# Patient Record
Sex: Male | Born: 1975 | Race: Black or African American | Hispanic: No | Marital: Married | Smoking: Never smoker
Health system: Southern US, Community
[De-identification: ages and names within clinical notes are randomized; demographics above are authoritative.]

## PROBLEM LIST (undated history)

## (undated) DIAGNOSIS — Z789 Other specified health status: Secondary | ICD-10-CM

## (undated) HISTORY — DX: Other specified health status: Z78.9

---

## 2008-05-26 HISTORY — PX: MIDDLE EAR SURGERY: SHX713

## 2016-10-08 ENCOUNTER — Encounter (HOSPITAL_COMMUNITY): Payer: Self-pay | Admitting: Emergency Medicine

## 2016-10-08 ENCOUNTER — Emergency Department (HOSPITAL_COMMUNITY)
Admission: EM | Admit: 2016-10-08 | Discharge: 2016-10-08 | Disposition: A | Discharge status: Home / Self Care | Payer: Self-pay | Attending: Emergency Medicine | Admitting: Emergency Medicine

## 2016-10-08 ENCOUNTER — Emergency Department (HOSPITAL_COMMUNITY): Payer: Self-pay

## 2016-10-08 DIAGNOSIS — W1839XA Other fall on same level, initial encounter: Secondary | ICD-10-CM | POA: Insufficient documentation

## 2016-10-08 DIAGNOSIS — M25561 Pain in right knee: Secondary | ICD-10-CM | POA: Insufficient documentation

## 2016-10-08 DIAGNOSIS — Y999 Unspecified external cause status: Secondary | ICD-10-CM | POA: Insufficient documentation

## 2016-10-08 DIAGNOSIS — Y92322 Soccer field as the place of occurrence of the external cause: Secondary | ICD-10-CM | POA: Insufficient documentation

## 2016-10-08 DIAGNOSIS — Y9366 Activity, soccer: Secondary | ICD-10-CM | POA: Insufficient documentation

## 2016-10-08 MED ORDER — NAPROXEN 250 MG PO TABS
375.0000 mg | ORAL_TABLET | Freq: Once | ORAL | Status: AC
  Administered 2016-10-08: 375 mg via ORAL
  Filled 2016-10-08: qty 2

## 2016-10-08 NOTE — ED Notes (Signed)
Ortho tech notified.  

## 2016-10-08 NOTE — ED Triage Notes (Signed)
Pt. Stated, I was playing soccer and hurt my left knee

## 2016-10-08 NOTE — ED Notes (Signed)
Patient transported to X-ray 

## 2016-10-08 NOTE — Progress Notes (Signed)
Orthopedic Tech Progress Note Patient Details:  Dan Jimenez [birth year] [id number]  Ortho Devices Type of Ortho Device: Knee Immobilizer Ortho Device/Splint Location: rle Ortho Device/Splint Interventions: Application   Nikki Domrawford, Taj Nevins 10/08/2016, 12:40 PM

## 2016-10-08 NOTE — ED Notes (Signed)
Pt injured right knee while playing soccer last week. Pain is right lateral knee. No swelling or deformity noted.

## 2016-10-08 NOTE — Discharge Instructions (Signed)
X-ray showed no signs of fracture. Motrin and tylenol as needed for pain. Ice affected area (see instructions below).  Please call the orthopedic physician listed today or first thing in the morning to schedule a follow up appointment.   Wear the splint while mobile.  COLD THERAPY DIRECTIONS:  Ice or gel packs can be used to reduce both pain and swelling. Ice is the most helpful within the first 24 to 48 hours after an injury or flareup from overusing a muscle or joint.  Ice is effective, has very few side effects, and is safe for most people to use.   If you expose your skin to cold temperatures for too long or without the proper protection, you can damage your skin or nerves. Watch for signs of skin damage due to cold.   HOME CARE INSTRUCTIONS  Follow these tips to use ice and cold packs safely.  Place a dry or damp towel between the ice and skin. A damp towel will cool the skin more quickly, so you may need to shorten the time that the ice is used.  For a more rapid response, add gentle compression to the ice.  Ice for no more than 10 to 20 minutes at a time. The bonier the area you are icing, the less time it will take to get the benefits of ice.  Check your skin after 5 minutes to make sure there are no signs of a poor response to cold or skin damage.  Rest 20 minutes or more in between uses.  Once your skin is numb, you can end your treatment. You can test numbness by very lightly touching your skin. The touch should be so light that you do not see the skin dimple from the pressure of your fingertip. When using ice, most people will feel these normal sensations in this order: cold, burning, aching, and numbness.

## 2016-10-08 NOTE — ED Provider Notes (Signed)
[facility]-EMERGENCY DEPT Provider Note    By signing my name below, I, Earmon PhoenixJennifer Waddell, attest that this documentation has been prepared under the direction and in the presence of Demetrios LollKenneth Jorden Minchey, PA-C. Electronically Signed: Earmon PhoenixJennifer Waddell, ED Scribe. 10/08/16. 12:38 PM.    History   Chief Complaint Chief Complaint  Patient presents with  . Knee Pain   The history is provided by the patient and medical records. No language interpreter was used.    Dan Jimenez is a 41 y.o. male who presents to the Emergency Department complaining of right lateral knee pain that began last week after falling on it playing soccer. He has taken Ibuprofen for pain. Walking on it increases the pain. He denies alleviating factors. He denies numbness, tingling or weakness of the RLE, bruising, wounds.   History reviewed. No pertinent past medical history.  There are no active problems to display for this patient.   History reviewed. No pertinent surgical history.     Home Medications    Prior to Admission medications   Not on File    Family History No family history on file.  Social History Social History  Substance Use Topics  . Smoking status: Never Smoker  . Smokeless tobacco: Never Used  . Alcohol use No     Allergies   Patient has no allergy information on record.   Review of Systems Review of Systems  Musculoskeletal: Positive for arthralgias and myalgias. Negative for joint swelling.  Skin: Negative for color change and wound.  Neurological: Negative for weakness and numbness.     Physical Exam Updated Vital Signs BP (!) 132/96 (BP Location: Right Arm)   Pulse 69   Temp 98.6 F (37 C) (Oral)   Resp 16   Ht 5\' 10"  (1.778 m)   Wt 170 lb (77.1 kg)   SpO2 100%   BMI 24.39 kg/m   Physical Exam  Constitutional: He is oriented to person, place, and time. He appears well-developed and well-nourished.  HENT:  Head: Normocephalic and atraumatic.  Neck:  Normal range of motion.  Cardiovascular: Normal rate.   Dorsalis Pedis pulses 2+ bilaterally. Cap refill normal.  Pulmonary/Chest: Effort normal.  Musculoskeletal: Normal range of motion.  Full ROM of right knee. No obvious deformity, edema, ecchymosis, crepitus or erythema. Point tenderness to lateral joint line. No joint laxity.  Neurological: He is alert and oriented to person, place, and time.  Sensations intact.  Skin: Skin is warm and dry.  Psychiatric: He has a normal mood and affect. His behavior is normal.  Nursing note and vitals reviewed.    ED Treatments / Results  DIAGNOSTIC STUDIES: Oxygen Saturation is 100% on RA, normal by my interpretation.   COORDINATION OF CARE: 12:14 PM- Will order knee immobilizer. Offered crutches but pt has one at bedside that he states is enough. Will give Naproxen prior to d/c. Encouraged RICE therapy. Pt verbalizes understanding and agrees to plan.  Medications  naproxen (NAPROSYN) tablet 375 mg (375 mg Oral Given 10/08/16 1234)    Labs (all labs ordered are listed, but only abnormal results are displayed) Labs Reviewed - No data to display  EKG  EKG Interpretation None       Radiology Dg Knee Complete 4 Views Right  Result Date: 10/08/2016 CLINICAL DATA:  Soccer injury yesterday with right knee pain and swelling. EXAM: RIGHT KNEE - COMPLETE 4+ VIEW COMPARISON:  None. FINDINGS: No evidence of fracture, dislocation, or joint effusion. No evidence of arthropathy or other  focal bone abnormality. Soft tissues are unremarkable. IMPRESSION: Negative. Electronically Signed   By: Elberta Fortis M.D.   On: 10/08/2016 11:51    Procedures Procedures (including critical care time)  Medications Ordered in ED Medications  naproxen (NAPROSYN) tablet 375 mg (375 mg Oral Given 10/08/16 1234)     Initial Impression / Assessment and Plan / ED Course  I have reviewed the triage vital signs and the nursing notes.  Pertinent labs & imaging  results that were available during my care of the patient were reviewed by me and considered in my medical decision making (see chart for details).     Patient presents to the ED with right knee pain following a mechanical fall playing soccer 1 week ago. Patient X-Ray negative for obvious fracture or dislocation. Pt advised to follow up with orthopedics. Patient given knee immobilizer while in ED, conservative therapy recommended and discussed. Patient will be discharged home & is agreeable with above plan. Returns precautions discussed. Pt appears safe for discharge.   Final Clinical Impressions(s) / ED Diagnoses   Final diagnoses:  Acute pain of right knee    New Prescriptions New Prescriptions   No medications on file   I personally performed the services described in this documentation, which was scribed in my presence. The recorded information has been reviewed and is accurate.     Rise Mu, PA-C 10/08/16 1253    Lavera Guise, MD 10/08/16 [phone]

## 2016-10-17 ENCOUNTER — Ambulatory Visit (INDEPENDENT_AMBULATORY_CARE_PROVIDER_SITE_OTHER): Payer: Self-pay | Admitting: Family Medicine

## 2016-10-17 ENCOUNTER — Ambulatory Visit: Payer: Self-pay | Admitting: Family Medicine

## 2016-10-17 ENCOUNTER — Encounter: Payer: Self-pay | Admitting: Family Medicine

## 2016-10-17 VITALS — BP 130/84 | HR 66 | Temp 97.9°F | Resp 14 | Ht 69.5 in | Wt 184.0 lb

## 2016-10-17 DIAGNOSIS — S8991XD Unspecified injury of right lower leg, subsequent encounter: Secondary | ICD-10-CM

## 2016-10-17 DIAGNOSIS — Z789 Other specified health status: Secondary | ICD-10-CM | POA: Insufficient documentation

## 2016-10-17 DIAGNOSIS — M25561 Pain in right knee: Secondary | ICD-10-CM

## 2016-10-17 MED ORDER — KETOROLAC TROMETHAMINE 30 MG/ML IJ SOLN
30.0000 mg | Freq: Once | INTRAMUSCULAR | Status: AC
  Administered 2016-10-17: 30 mg via INTRAMUSCULAR

## 2016-10-17 MED ORDER — IBUPROFEN 600 MG PO TABS: 600.0000 mg | ORAL_TABLET | Freq: Three times a day (TID) | ORAL | 0 refills | Status: DC | PRN

## 2016-10-17 MED FILL — IBUPROFEN 600 MG TABLET: 600 | 10 days supply | Qty: 30 | Fill #0

## 2016-10-17 NOTE — Patient Instructions (Addendum)
20  4           interchangeabley Ibuprofen 600   8                       3-4.          .              .           .               .     :         .            (   ).          .        .  ()    .             .       :             Recommend applying ice to right knee 20 minutes 4 times per day as needed Apply warm, moist compresses interchangeabley Ibuprofen 600 mg every 8 hours as needed Elevate right knee to heart level while at rest  Refrain from weight bearing over the next 3-4 weeks.     Place knee pain patient instructions here.  Knee Pain, Adult Many things can cause knee pain. The pain often goes away on its own with time and rest. If the pain does not go away, tests may be done to find out what is causing the pain. Follow these instructions at home: Activity  Rest your knee.  Do not do things that cause pain.  Avoid activities where both feet leave the ground at the same time (high-impact activities). Examples are running, jumping rope, and doing jumping jacks. General instructions  Take medicines only as told by your doctor.  Raise (elevate) your knee when you are resting. Make sure your knee is higher than your heart.  Sleep with a pillow under your knee.  If  told, put ice on the knee: ? Put ice in a plastic bag. ? Place a towel between your skin and the bag. ? Leave the ice on for 20 minutes, 2-3 times a day.  Ask your doctor if you should wear an elastic knee support.  Lose weight if you are overweight. Being overweight can make your knee hurt more.  Do not use any tobacco products. These include cigarettes, chewing tobacco, or electronic cigarettes. If you need help quitting, ask your doctor. Smoking may slow down healing. Contact a doctor if:  The pain does not stop.  The pain changes or gets worse.  You have a fever along with knee pain.  Your knee gives out or locks up.  Your knee swells, and becomes worse. Get help right away if:  Your knee feels warm.  You cannot move your knee.  You have very bad knee pain.  You have chest pain.  You have trouble breathing. Summary  Many things can cause knee pain. The pain often goes away on its own with time and rest.  Avoid activities that put stress on your knee. These include running and jumping rope.  Get help right away if you cannot move your knee, or if your knee feels warm, or if you have trouble breathing. This information is not intended to replace advice given to you by your health care provider.  Make sure you discuss any questions you have with your health care provider. Document Released: 07/01/2008 Document Revised: 03/29/2016 Document Reviewed: 03/29/2016 Elsevier Interactive Patient Education  2017 [employer]

## 2016-10-17 NOTE — Progress Notes (Signed)
Subjective:    Patient ID: Dan Jimenez, male    DOB: 1976/03/29, 41 y.o.   MRN: [id number]  HPI Mr. Dyke BrackettMohamed Venturino, a 41 year old male that presents to establish care. He primarily speaks Arabic, utilizing video interpreter to assist with communication. He was treated and evaluated in the emergency department on 10/08/16 after sustaining a right knee injury playing soccer. Patient had a right knee xray that was negative for fractures or bony abnormalities. His pain intensity is 8/10 described as constant and throbbing. Pain is worsened with weight bearing. He last had Ibuprofen 2 days ago without sustained relief. He is wearing a right leg immobilizer and using a crutch to assist with ambulation. He denies numbness, weakness, or tingling. Patient is requesting a referral to an orthopedic specialist.   Past Medical History:  Diagnosis Date  . Language barrier to communication     Social History   Social History  . Marital status: Married    Spouse name: N/A  . Number of children: N/A  . Years of education: N/A   Occupational History  . Not on file.   Social History Main Topics  . Smoking status: Never Smoker  . Smokeless tobacco: Never Used  . Alcohol use No  . Drug use: No  . Sexual activity: Not on file   Other Topics Concern  . Not on file   Social History Narrative  . No narrative on file    Review of Systems  Constitutional: Negative.   HENT: Negative.   Respiratory: Negative.   Cardiovascular: Negative.   Gastrointestinal: Negative.   Endocrine: Negative.   Genitourinary: Negative.   Musculoskeletal: Positive for joint swelling and myalgias (Right knee pain).  Neurological: Negative.   Hematological: Negative.   Psychiatric/Behavioral: Negative.        Objective:   Physical Exam  Constitutional: He appears well-developed.  HENT:  Head: Normocephalic and atraumatic.  Right Ear: External ear normal.  Left Ear: External ear normal.  Nose:  Nose normal.  Mouth/Throat: Oropharynx is clear and moist.  Eyes: Conjunctivae are normal. Pupils are equal, round, and reactive to light.  Neck: Normal range of motion. Neck supple.  Abdominal: Soft. Bowel sounds are normal.  Musculoskeletal:       Right shoulder: He exhibits decreased range of motion, tenderness, swelling, pain and decreased strength. He exhibits no bony tenderness and no spasm.       Right knee: He exhibits decreased range of motion and swelling. Tenderness found.  Skin: Skin is warm and dry.  Psychiatric: He has a normal mood and affect. His behavior is normal. Judgment and thought content normal.      BP 130/84 (BP Location: Right Arm, Patient Position: Sitting, Cuff Size: Normal)   Pulse 66   Temp 97.9 F (36.6 C) (Oral)   Resp 14   Ht 5' 9.5" (1.765 m)   Wt 184 lb (83.5 kg)   SpO2 99%   BMI 26.78 kg/m  Assessment & Plan:  1. Acute pain of right knee Recommend applying ice pack to right 20 minutes 4 times per day as needed. Apply warm compresses interchangeably.  Elevate knee to heart level, while at rest.  Continue to wear immobilizer. Patient is requesting a referral to orthopedic specialist for further evaluation Reviewed right knee xray, unremarkable.  - Sedimentation Rate - C-reactive protein - ketorolac (TORADOL) 30 MG/ML injection 30 mg; Inject 1 mL (30 mg total) into the muscle once. - ibuprofen (ADVIL,MOTRIN) 600 MG  tablet; Take 1 tablet (600 mg total) by mouth every 8 (eight) hours as needed.  Dispense: 30 tablet; Refill: 0 - Ambulatory referral to Sports Medicine  2. Injury of right knee, subsequent encounter - ketorolac (TORADOL) 30 MG/ML injection 30 mg; Inject 1 mL (30 mg total) into the muscle once. - ibuprofen (ADVIL,MOTRIN) 600 MG tablet; Take 1 tablet (600 mg total) by mouth every 8 (eight) hours as needed.  Dispense: 30 tablet; Refill: 0 - Ambulatory referral to Sports Medicine  3. Language barrier to communication Utilized video  interpreter to assist with communication    RTC: Follow up in office for CPE    Nolon Nations  MSN, FNP-C [facility] Patient [facility] [address] [place], [place] [address] [phone]

## 2016-10-18 LAB — SEDIMENTATION RATE: Sed Rate: 1 mm/hr (ref 0–15)

## 2016-10-18 LAB — C-REACTIVE PROTEIN: CRP: 1.8 mg/L (ref <8.0)

## 2016-10-20 ENCOUNTER — Ambulatory Visit: Payer: Self-pay | Attending: Internal Medicine

## 2016-10-26 ENCOUNTER — Ambulatory Visit: Payer: Self-pay | Admitting: Family Medicine

## 2016-10-27 ENCOUNTER — Encounter: Payer: Self-pay | Admitting: Sports Medicine

## 2016-10-27 ENCOUNTER — Ambulatory Visit (INDEPENDENT_AMBULATORY_CARE_PROVIDER_SITE_OTHER): Payer: Self-pay | Admitting: Sports Medicine

## 2016-10-27 VITALS — BP 127/70 | Ht 72.84 in | Wt 183.0 lb

## 2016-10-27 DIAGNOSIS — M25561 Pain in right knee: Secondary | ICD-10-CM

## 2016-10-28 NOTE — Progress Notes (Signed)
   Subjective:    Patient ID: Dan Jimenez, male    DOB: May 11, 1975, 41 y.o.   MRN: [id number]  HPI chief complaint: Right knee pain  Very pleasant 41 year old male comes in today after having injured his right knee playing soccer on June 23. He fell onto the right knee and heard a pop. He had immediate pain and was unable to continue playing. Pain was severe enough that he sought treatment in the emergency room. X-rays were obtained and are available for review. He was given a set of crutches and prescribed ibuprofen. Majority of his pain is along the lateral knee. He is still having difficulty bearing weight. He noticed some swelling shortly after the injury but that has since subsided. He denies any problems with this knee in the past. No prior knee surgeries. He denies pain in his hip. Denies numbness or tingling into his foot.  Past medical history reviewed Medications reviewed Allergies reviewed    Review of Systems    as above Objective:   Physical Exam  Well-developed, well-nourished. No acute distress. Awake alert and oriented 3. Vital signs reviewed  Right knee: Range of motion 0-120. No obvious effusion. Patient is tender to palpation along the lateral joint line with a positive Thessaly's. Equivocal McMurray's. Slight tenderness over the lateral aspect of the proximal tibia as well. Knee is stable to valgus and varus stressing. Negative Lachman's, negative anterior drawer. Negative posterior drawer. Negative patellar apprehension. Negative dial test. Neurovascularly intact distally. Walking with a limp and the assistance of crutches.  X-rays from 10/08/2016 are reviewed. The included AP, lateral, and oblique views from the emergency room. These films show no obvious fracture.      Assessment & Plan:   Right knee pain-rule out lateral meniscal tear  MRI specifically to rule out a lateral meniscal tear. Patient will follow-up with me in the office one day after  that study to discuss the results and delineate a more definitive treatment plan. In the meantime, he will continue with his compression sleeve and will continue using his crutches to assist with ambulation. He may also continue with ibuprofen as needed for pain. He is encouraged to call with any questions or concerns he may have prior to his follow-up visit.

## 2016-10-31 ENCOUNTER — Other Ambulatory Visit: Payer: Self-pay

## 2016-10-31 ENCOUNTER — Ambulatory Visit: Payer: Self-pay | Admitting: Family Medicine

## 2016-10-31 ENCOUNTER — Ambulatory Visit
Admission: RE | Admit: 2016-10-31 | Discharge: 2016-10-31 | Disposition: A | Discharge status: Home / Self Care | Payer: No Typology Code available for payment source | Source: Ambulatory Visit | Attending: Sports Medicine | Admitting: Sports Medicine

## 2016-10-31 DIAGNOSIS — M25561 Pain in right knee: Secondary | ICD-10-CM

## 2016-11-03 ENCOUNTER — Ambulatory Visit (INDEPENDENT_AMBULATORY_CARE_PROVIDER_SITE_OTHER): Payer: Self-pay | Admitting: Sports Medicine

## 2016-11-03 ENCOUNTER — Encounter: Payer: Self-pay | Admitting: Sports Medicine

## 2016-11-03 VITALS — BP 126/80 | Ht 72.0 in | Wt 182.0 lb

## 2016-11-03 DIAGNOSIS — S83511D Sprain of anterior cruciate ligament of right knee, subsequent encounter: Secondary | ICD-10-CM

## 2016-11-03 DIAGNOSIS — M25561 Pain in right knee: Secondary | ICD-10-CM

## 2016-11-03 MED ORDER — MELOXICAM 15 MG PO TABS: 15.0000 mg | ORAL_TABLET | Freq: Every day | ORAL | 1 refills | Status: DC

## 2016-11-03 MED FILL — ?MELOXICAM 15MG TABLET: 15 | 40 days supply | Qty: 40 | Fill #0

## 2016-11-03 NOTE — Progress Notes (Signed)
  Patient comes in today to discuss MRI findings of his right knee. MRI shows findings consistent with a partial anterior cruciate ligament tear. This may be chronic. There is no bone bruising. No meniscal tears. No significant degenerative changes. I recommended conservative treatment with a double upright brace and physical therapy. Patient's given a prescription for meloxicam to take as needed for pain. Follow-up with me in 4 weeks for reevaluation. Call with questions or concerns in the interim.  Total time spent with the patient was 10 minutes with greater than 50% of the time spent in face-to-face consultation discussing MRI findings and treatment plan.

## 2016-11-05 ENCOUNTER — Other Ambulatory Visit: Payer: Self-pay

## 2016-11-06 ENCOUNTER — Encounter: Payer: Self-pay | Admitting: Family Medicine

## 2016-11-07 ENCOUNTER — Encounter: Payer: Self-pay | Admitting: Family Medicine

## 2016-11-07 ENCOUNTER — Ambulatory Visit: Payer: Self-pay | Admitting: Sports Medicine

## 2016-11-07 ENCOUNTER — Ambulatory Visit (INDEPENDENT_AMBULATORY_CARE_PROVIDER_SITE_OTHER): Payer: No Typology Code available for payment source | Admitting: Family Medicine

## 2016-11-07 VITALS — BP 130/91 | HR 62 | Temp 97.9°F | Resp 16 | Ht 72.0 in | Wt 187.0 lb

## 2016-11-07 DIAGNOSIS — Z114 Encounter for screening for human immunodeficiency virus [HIV]: Secondary | ICD-10-CM

## 2016-11-07 DIAGNOSIS — Z23 Encounter for immunization: Secondary | ICD-10-CM

## 2016-11-07 DIAGNOSIS — Z0000 Encounter for general adult medical examination without abnormal findings: Secondary | ICD-10-CM

## 2016-11-07 DIAGNOSIS — Z789 Other specified health status: Secondary | ICD-10-CM

## 2016-11-07 DIAGNOSIS — R030 Elevated blood-pressure reading, without diagnosis of hypertension: Secondary | ICD-10-CM

## 2016-11-07 NOTE — Patient Instructions (Signed)

## 2016-11-07 NOTE — Progress Notes (Signed)
Subjective:    Patient ID: Dan Jimenez, male    DOB: [birth year], 41 y.o.   MRN: [id number]  HPI Dan Jimenez, a 41 year old male presents for a routine physical examination. He primarily speaks Arabic, using video interpreter to assist with communication.  Patient generally feels well and is without complaint. He eats a balanced diet and typically gets 5-6 hours of sleep. He does not exercise routinely, but plays soccer with friends on the weekend. He is not sexually active. He does not wear sunscreen. He has not had a routine dental or eye exam. He denies headache, chest pain, palpitation, fatigue, dysuria, nausea, vomiting, or diarrhea.   Past Medical History:  Diagnosis Date  . Language barrier to communication    Immunization History  Administered Date(s) Administered  . Tdap 11/07/2016    No Known Allergies Review of Systems  Constitutional: Negative.  Negative for fatigue and fever.  HENT: Negative.   Eyes: Negative.   Respiratory: Negative.   Cardiovascular: Negative.  Negative for chest pain, palpitations and leg swelling.  Gastrointestinal: Negative.   Endocrine: Negative.   Genitourinary: Negative.   Musculoskeletal: Negative.   Skin: Negative.  Negative for color change, pallor and rash.  Allergic/Immunologic: Negative.  Negative for immunocompromised state.  Neurological: Negative.  Negative for dizziness, facial asymmetry and headaches.  Hematological: Negative.   Psychiatric/Behavioral: Negative.        Objective:   Physical Exam  Constitutional: He is oriented to person, place, and time. He appears well-developed and well-nourished.  HENT:  Head: Normocephalic and atraumatic.  Right Ear: External ear normal.  Left Ear: External ear normal.  Nose: Nose normal.  Mouth/Throat: Oropharynx is clear and moist.  Eyes: Pupils are equal, round, and reactive to light. Conjunctivae and EOM are normal.  Neck: Normal range of motion. Neck supple.   Pulmonary/Chest: Effort normal and breath sounds normal.  Abdominal: Soft.  Musculoskeletal: Normal range of motion.  Neurological: He is alert and oriented to person, place, and time. He has normal reflexes.  Skin: Skin is warm and dry.  Psychiatric: He has a normal mood and affect. His behavior is normal. Judgment and thought content normal.     BP (!) 130/91 (BP Location: Right Arm, Patient Position: Sitting, Cuff Size: Normal)   Pulse 62   Temp 97.9 F (36.6 C) (Oral)   Resp 16   Ht 6' (1.829 m)   Wt 187 lb (84.8 kg)   SpO2 100%   BMI 25.36 kg/m  Assessment & Plan:   1. Physical exam, routine Physical exam unremarkable. He refuses prostate exam. Will return for screening laboratory values.  - COMPLETE METABOLIC PANEL WITH GFR; Future - CBC with Differential/Platelet; Future - HIV antibody (with reflex); Future  2. Blood pressure elevated without history of HTN Blood pressure is noted to be borderline elevated today, but normal in the past. No treatment warranted at this time.   3. Language barrier to communication Patient primarily speaks Arabic, utilized video interpreter to communicate  4. Screening for HIV (human immunodeficiency virus) - HIV antibody (with reflex); Future  5. Need for Tdap vaccination Received Tdap vaccination without complication   Preventative health maintenance:  Recommend cholesterol screening Recommend prostate exam Recommend a lowfat, low carbohydrate diet divided over 5-6 small meals, increase water intake to 6-8 glasses, and 150 minutes per week of cardiovascular exercise.    RTC: 1 year for annual physical or as needed   Nolon Nations  MSN, FNP-C  [facility] Patient [facility] [address]  [place], [place] [id number] [phone]

## 2016-11-08 ENCOUNTER — Encounter: Payer: Self-pay | Admitting: Physical Therapy

## 2016-11-08 ENCOUNTER — Ambulatory Visit: Payer: Self-pay | Admitting: Sports Medicine

## 2016-11-08 ENCOUNTER — Ambulatory Visit: Payer: Self-pay | Attending: Family Medicine | Admitting: Physical Therapy

## 2016-11-08 DIAGNOSIS — M25661 Stiffness of right knee, not elsewhere classified: Secondary | ICD-10-CM | POA: Insufficient documentation

## 2016-11-08 DIAGNOSIS — R262 Difficulty in walking, not elsewhere classified: Secondary | ICD-10-CM | POA: Insufficient documentation

## 2016-11-08 DIAGNOSIS — M25561 Pain in right knee: Secondary | ICD-10-CM | POA: Insufficient documentation

## 2016-11-08 NOTE — Therapy (Signed)
[facility] [address] [place], [place], [address] Phone: [phone]   Fax:  [phone]  Physical Therapy Evaluation  Patient Details  Name: Era BumpersMohamed M D Fessenden MRN: [id number] Date of Birth: [birth year] Referring Provider: Dr Reino Bellisimothy Draper   Encounter Date: 11/08/2016      PT End of Session - 11/08/16 1155    Visit Number 1   Number of Visits 16   Date for PT Re-Evaluation 01/03/17   Authorization Type CAFA   PT Start Time 1145   PT Stop Time 1232   PT Time Calculation (min) 47 min   Activity Tolerance Patient tolerated treatment well   Behavior [facility] assessed/performed      Past Medical History:  Diagnosis Date  . Language barrier to communication     Past Surgical History:  Procedure Laterality Date  . MIDDLE EAR SURGERY Left 05/26/2008    There were no vitals filed for this visit.       Subjective Assessment - 11/08/16 1151    Subjective Patient was playing soccer 4 weeks ago when he hurt his knee and partially tore his right ACL 50%. He has had some pain but it has improved. He mostly have pain when he is walking and carrying heavey objects.    Limitations Standing;Walking;House hold activities   How long can you sit comfortably? No limit    How long can you stand comfortably? > 45 minutes    How long can you walk comfortably? pain after about a mile    Diagnostic tests right knee MRI 50 % tear of the ACL    Currently in Pain? Yes   Pain Score 3    Pain Location Knee   Pain Orientation Right   Pain Descriptors / Indicators Aching   Pain Type Acute pain   Pain Onset 1 to 4 weeks ago   Pain Frequency Intermittent   Aggravating Factors  walking distances, carrying heaey objects   Pain Relieving Factors ice    Effect of Pain on Daily Activities difficulty walking distances and carry objects             [facility] - 11/08/16 0001      Assessment   Medical  Diagnosis Right partial ACL tear    Referring Provider Dr Reino Bellisimothy Draper    Onset Date/Surgical Date 10/08/16   Hand Dominance Right   Next MD Visit 11/10/2016   Prior Therapy None      Precautions   Precautions None     Restrictions   Weight Bearing Restrictions No     Balance Screen   Has the patient fallen in the past 6 months No   Has the patient had a decrease in activity level because of a fear of falling?  No   Is the patient reluctant to leave their home because of a fear of falling?  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      Cognition   Overall Cognitive Status Within Functional Limits for tasks assessed   Attention Focused   Focused Attention Appears intact   Memory Appears intact   Awareness Appears intact   Problem Solving Appears intact  Observation/Other Assessments   Focus on Therapeutic Outcomes (FOTO)  speaks arabic      Sensation   Light Touch Appears Intact   Additional Comments denies parathesias      Coordination   Gross Motor Movements are Fluid and Coordinated Yes   Fine Motor Movements are Fluid and Coordinated Yes     Functional Tests   Functional tests Single leg stance;Step up     Step Up   Comments patient can step up but right knee kicks into extension instead of having a smooth step up      Single Leg Stance   Comments 10 seconds with minor instability; patient reports improvement over the last few weeks      ROM / Strength   AROM / PROM / Strength AROM;Strength;PROM     AROM   AROM Assessment Site Knee   Right/Left Knee Right;Left   Right Knee Extension 0   Right Knee Flexion 110   Left Knee Extension 0   Left Knee Flexion 140     PROM   PROM Assessment Site Knee   Right/Left Knee Right;Left   Right  Knee Extension 0   Right Knee Flexion 115   Left Knee Extension 0   Left Knee Flexion 140     Strength   Overall Strength Comments left LE 5/5    Strength Assessment Site Hip;Knee   Right/Left Hip Right   Right Hip Flexion 4/5   Right Hip Extension 4/5   Right Hip ABduction 4/5   Right/Left Knee Right   Right Knee Flexion 4+/5   Right Knee Extension 4+/5     Palpation   Patella mobility good patellar mobility    Palpation comment No tenderness to palaption      Ambulation/Gait   Gait Comments slight decrease in right single leg stance time             Objective measurements completed on examination: See above findings.          [facility] Adult PT Treatment/Exercise - 11/08/16 0001      Knee/Hip Exercises: Standing   Heel Raises Limitations 2x10     Knee/Hip Exercises: Supine   Bridges Limitations 2x10    Other Supine Knee/Hip Exercises knee flexion with strap in pain free range   Other Supine Knee/Hip Exercises 3 way hip SLR x10 but advised to progress to 3x10 as tolerated.                 PT Education - 11/08/16 1323    Education provided Yes   Education Details HEP; symptom mangement,    Person(s) Educated Patient   Methods Explanation;Demonstration;Tactile cues;Verbal cues   Comprehension Verbalized understanding;Returned demonstration;Verbal cues required;Tactile cues required          PT Short Term Goals - 11/08/16 1336      PT SHORT TERM GOAL #1   Title Patient will demsotrate 130 degrees of passive knee flexion    Time 4   Period Weeks   Status New     PT SHORT TERM GOAL #2   Title Patient will increase gross right LE strength to 4+/5    Time 4   Period Weeks   Status New     PT SHORT TERM GOAL #3   Title Patient will be independent with inital HEP dfor quad strength and stability    Time 4   Period Weeks   Status New     PT SHORT TERM GOAL #4  Title Patient will increase right single leg stance time to 30 seconds    Time 4    Period Weeks   Status New           PT Long Term Goals - 11/08/16 1404      PT LONG TERM GOAL #1   Title Patient will ambualte 2 miles without selfreport of pain in order to perfrom daily tasks    Time 8   Period Weeks   Status New   Target Date 01/03/17     PT LONG TERM GOAL #2   Title Patient will go up/ down 8 steps with reciprocol gait pattern without increased pain    Time 8   Period Weeks   Status New   Target Date 01/03/17     PT LONG TERM GOAL #3   Title Patient will demonstrate 5/5 gross right lower extremity strength in order to perfrom work tasks without pain    Time 8   Period Weeks   Status New   Target Date 01/03/17                Plan - 11/08/16 1325    Clinical Impression Statement Patient is a 41 year old male who presents with a partial ACL tear on the right. He has decreased strength and single leg stability but it has improved. He has decreased passive range of motion on the right compared to the left. He has increased pain with ambualtion and with carrying objects. He would benefit from skilled therapy to improve right LE stability and to improve his ability to stand at work.    Clinical Presentation Stable   Clinical Decision Making Low   Rehab Potential Good   PT Frequency 2x / week   PT Duration 8 weeks   PT Treatment/Interventions ADLs/Self Care Home Management;Cryotherapy;Iontophoresis 4mg /ml Dexamethasone;Gait training;Traction;Moist Heat;Therapeutic activities;Therapeutic exercise;Neuromuscular re-education;Patient/family education;Passive range of motion;Manual techniques;Splinting;Taping   PT Next Visit Plan add mini squats, single leg stance, lateral band walk, cone drill, review step ups if able; assess range.    PT Home Exercise Plan 3 way SLR, bridging, clam, heel raise, passive knee flexion   Consulted and Agree with Plan of Care Patient      Patient will benefit from skilled therapeutic intervention in order to improve the  following deficits and impairments:  Abnormal gait, Pain, Decreased strength, Decreased endurance, Decreased activity tolerance, Difficulty walking, Decreased range of motion  Visit Diagnosis: Acute pain of right knee  Stiffness of right knee, not elsewhere classified  Difficulty in walking, not elsewhere classified     Problem List Patient Active Problem List   Diagnosis Date Noted  . Acute pain of right knee 10/17/2016  . Language barrier to communication 10/17/2016    Dessie Coma PT DPT  11/08/2016, 5:02 PM  [facility] [address] [place], [place], [address] Phone: [phone]   Fax:  [phone]  Name: DEWAUN KINZLER MRN: [id number] Date of Birth: [birth year]

## 2016-11-08 NOTE — Therapy (Deleted)
[facility] [address] [place], [place], [address] Phone: [phone]   Fax:  [phone]  Physical Therapy Treatment  Patient Details  Name: Dan BumpersMohamed M D Jimenez MRN: [id number] Date of Birth: [birth year] Referring Provider: Dr Reino Bellisimothy Draper   Encounter Date: 11/08/2016      PT End of Session - 11/08/16 1155    Visit Number 1   Number of Visits 16   Date for PT Re-Evaluation 01/03/17   Authorization Type CAFA   PT Start Time 1145   PT Stop Time 1232   PT Time Calculation (min) 47 min   Activity Tolerance Patient tolerated treatment well   Behavior During Therapy [facility] for tasks assessed/performed      Past Medical History:  Diagnosis Date  . Language barrier to communication     Past Surgical History:  Procedure Laterality Date  . MIDDLE EAR SURGERY Left 05/26/2008    There were no vitals filed for this visit.      Subjective Assessment - 11/08/16 1151    Subjective Patient was playing soccer 4 weeks ago when he hurt his knee and partially tore his right ACL 50%. He has had some pain but it has improved. He mostly have pain when he is walking and carrying heavey objects.    Limitations Standing;Walking;House hold activities   How long can you sit comfortably? No limit    How long can you stand comfortably? > 45 minutes    How long can you walk comfortably? pain after about a mile    Diagnostic tests right knee MRI 50 % tear of the ACL    Currently in Pain? Yes   Pain Score 3    Pain Location Knee   Pain Orientation Right   Pain Descriptors / Indicators Aching   Pain Type Acute pain   Pain Onset 1 to 4 weeks ago   Pain Frequency Intermittent   Aggravating Factors  walking distances, carrying heaey objects   Pain Relieving Factors ice    Effect of Pain on Daily Activities difficulty walking distances and carry objects             [facility] - 11/08/16 0001      Assessment   Medical  Diagnosis Right partial ACL tear    Referring Provider Dr Reino Bellisimothy Draper    Onset Date/Surgical Date 10/08/16   Hand Dominance Right   Next MD Visit 11/10/2016   Prior Therapy None      Precautions   Precautions None     Restrictions   Weight Bearing Restrictions No     Balance Screen   Has the patient fallen in the past 6 months No   Has the patient had a decrease in activity level because of a fear of falling?  No   Is the patient reluctant to leave their home because of a fear of falling?  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      Cognition   Overall Cognitive Status Within Functional Limits for tasks assessed   Attention Focused   Focused Attention Appears intact   Memory Appears intact   Awareness Appears intact   Problem Solving Appears intact     Observation/Other  Assessments   Focus on Therapeutic Outcomes (FOTO)  speaks arabic      Sensation   Light Touch Appears Intact   Additional Comments denies parathesias      Coordination   Gross Motor Movements are Fluid and Coordinated Yes   Fine Motor Movements are Fluid and Coordinated Yes     Functional Tests   Functional tests Single leg stance;Step up     Step Up   Comments patient can step up but right knee kicks into extension instead of having a smooth step up      Single Leg Stance   Comments 10 seconds with minor instability; patient reports improvement over the last few weeks      ROM / Strength   AROM / PROM / Strength AROM;Strength;PROM     AROM   AROM Assessment Site Knee   Right/Left Knee Right;Left   Right Knee Extension 0   Right Knee Flexion 110   Left Knee Extension 0   Left Knee Flexion 140     PROM   PROM Assessment Site Knee   Right/Left Knee Right;Left   Right  Knee Extension 0   Right Knee Flexion 115   Left Knee Extension 0   Left Knee Flexion 140     Strength   Overall Strength Comments left LE 5/5    Strength Assessment Site Hip;Knee   Right/Left Hip Right   Right Hip Flexion 4/5   Right Hip Extension 4/5   Right Hip ABduction 4/5   Right/Left Knee Right   Right Knee Flexion 4+/5   Right Knee Extension 4+/5     Palpation   Patella mobility good patellar mobility    Palpation comment No tenderness to palaption      Ambulation/Gait   Gait Comments slight decrease in right single leg stance time                      [facility] Adult PT Treatment/Exercise - 11/08/16 0001      Knee/Hip Exercises: Standing   Heel Raises Limitations 2x10     Knee/Hip Exercises: Supine   Bridges Limitations 2x10    Other Supine Knee/Hip Exercises knee flexion with strap in pain free range   Other Supine Knee/Hip Exercises 3 way hip SLR x10 but advised to progress to 3x10 as tolerated.                 PT Education - 11/08/16 1323    Education provided Yes   Education Details HEP; symptom mangement,    Person(s) Educated Patient   Methods Explanation;Demonstration;Tactile cues;Verbal cues   Comprehension Verbalized understanding;Returned demonstration;Verbal cues required;Tactile cues required          PT Short Term Goals - 11/08/16 1336      PT SHORT TERM GOAL #1   Title Patient will demsotrate 130 degrees of passive knee flexion    Time 4   Period Weeks   Status New     PT SHORT TERM GOAL #2   Title Patient will increase gross right LE strength to 4+/5    Time 4   Period Weeks   Status New     PT SHORT TERM GOAL #3   Title Patient will be independent with inital HEP dfor quad strength and stability    Time 4   Period Weeks   Status New     PT SHORT TERM GOAL #4   Title Patient will increase right single leg stance  time to 30 seconds    Time 4   Period Weeks   Status New           PT Long Term Goals -  11/08/16 1404      PT LONG TERM GOAL #1   Title Patient will ambualte 2 miles without selfreport of pain in order to perfrom daily tasks    Time 8   Period Weeks   Status New   Target Date 01/03/17     PT LONG TERM GOAL #2   Title Patient will go up/ down 8 steps with reciprocol gait pattern without increased pain    Time 8   Period Weeks   Status New   Target Date 01/03/17     PT LONG TERM GOAL #3   Title Patient will demonstrate 5/5 gross right lower extremity strength in order to perfrom work tasks without pain    Time 8   Period Weeks   Status New   Target Date 01/03/17               Plan - 11/08/16 1325    Clinical Impression Statement Patient is a 41 year old male who presents with a partial ACL tear on the right. He has decreased strength and single leg stability but it has improved. He has decreased passive range of motion on the right compared to the left. He has increased pain with ambualtion and with carrying objects. He would benefit from skilled therapy to improve right LE stability and to improve his ability to stand at work.    Clinical Presentation Stable   Clinical Decision Making Low   Rehab Potential Good   PT Frequency 2x / week   PT Duration 8 weeks   PT Treatment/Interventions ADLs/Self Care Home Management;Cryotherapy;Iontophoresis 4mg /ml Dexamethasone;Gait training;Traction;Moist Heat;Therapeutic activities;Therapeutic exercise;Neuromuscular re-education;Patient/family education;Passive range of motion;Manual techniques;Splinting;Taping   PT Next Visit Plan add mini squats, single leg stance, lateral band walk, cone drill, review step ups if able; assess range.    PT Home Exercise Plan 3 way SLR, bridging, clam, heel raise, passive knee flexion   Consulted and Agree with Plan of Care Patient      Patient will benefit from skilled therapeutic intervention in order to improve the following deficits and impairments:  Abnormal gait, Pain, Decreased  strength, Decreased endurance, Decreased activity tolerance, Difficulty walking, Decreased range of motion  Visit Diagnosis: Acute pain of right knee  Stiffness of right knee, not elsewhere classified  Difficulty in walking, not elsewhere classified     Problem List Patient Active Problem List   Diagnosis Date Noted  . Acute pain of right knee 10/17/2016  . Language barrier to communication 10/17/2016    Dessie Coma 11/08/2016, 5:01 PM  [facility] [address] [place], [place], [address] Phone: [phone]   Fax:  [phone]  Name: GERVASE COLBERG MRN: [id number] Date of Birth: [birth year]

## 2016-11-09 ENCOUNTER — Ambulatory Visit: Payer: Self-pay | Admitting: Physical Therapy

## 2016-11-09 DIAGNOSIS — R262 Difficulty in walking, not elsewhere classified: Secondary | ICD-10-CM

## 2016-11-09 DIAGNOSIS — M25661 Stiffness of right knee, not elsewhere classified: Secondary | ICD-10-CM

## 2016-11-09 DIAGNOSIS — M25561 Pain in right knee: Secondary | ICD-10-CM

## 2016-11-09 NOTE — Therapy (Signed)
[place], [place], [address] Phone: [phone]   Fax:  [phone]  Physical Therapy Treatment  Patient Details  Name: Dan Jimenez MRN: [id number] Date of Birth: [birth year] Referring Provider: Dr Lilia Argue   Encounter Date: 11/09/2016      PT End of Session - 11/09/16 1027    Visit Number 2   Number of Visits 16   Date for PT Re-Evaluation 01/03/17   Authorization Type CAFA   PT Start Time 1018   PT Stop Time 1106   PT Time Calculation (min) 48 min   Activity Tolerance Patient tolerated treatment well   Behavior During Therapy [facility] for tasks assessed/performed      Past Medical History:  Diagnosis Date  . Language barrier to communication     Past Surgical History:  Procedure Laterality Date  . MIDDLE EAR SURGERY Left 05/26/2008    There were no vitals filed for this visit.      Subjective Assessment - 11/09/16 1022    Subjective Only min pain today.  Did his exercises last night.    Currently in Pain? Yes   Pain Score 1             [facility] PT Assessment - 11/09/16 0001      AROM   Right Knee Extension 0   Right Knee Flexion 130                     [facility] Adult PT Treatment/Exercise - 11/09/16 0001      Knee/Hip Exercises: Stretches   Active Hamstring Stretch Right;3 reps;30 seconds   Knee: Self-Stretch to increase Flexion Right;5 reps;10 seconds   Knee: Self-Stretch Limitations strap      Knee/Hip Exercises: Standing   Heel Raises Both;2 sets;10 reps   Hip Flexion AAROM;Stengthening;Both;1 set;10 reps   Hip Flexion Limitations high knees , slow pace for SLS    Wall Squat 1 set;10 reps     Knee/Hip Exercises: Seated   Long Arc Quad Strengthening;Right;1 set;10 reps   Long Arc Quad Limitations red   Hamstring Curl Strengthening;Right;1 set;10 reps   Sit to [employer] 1 set;without UE support     Knee/Hip Exercises: Supine   Bridges Limitations 2  x 10    Single Leg Bridge Strengthening;Right;1 set;10 reps   Other Supine Knee/Hip Exercises 3 way hip SLR x10 but advised to progress to 3x10 as tolerated.   abd, ext, flex     Modalities   Modalities Cryotherapy     Cryotherapy   Number Minutes Cryotherapy 10 Minutes   Cryotherapy Location Knee   Type of Cryotherapy Ice pack                PT Education - 11/09/16 1449    Education provided Yes   Education Details how and when to progress exercises, proceed with consistency, ice post , HEP    Person(s) Educated Patient   Methods Explanation;Demonstration;Verbal cues;Handout   Comprehension Verbalized understanding;Returned demonstration;Need further instruction          PT Short Term Goals - 11/09/16 1454      PT SHORT TERM GOAL #1   Title Patient will demsotrate 130 degrees of passive knee flexion    Baseline 130 deg AROM    Status Achieved     PT SHORT TERM GOAL #2   Title Patient will increase gross right LE strength to 4+/5    Status Unable to assess  PT SHORT TERM GOAL #3   Title Patient will be independent with inital HEP dfor quad strength and stability    Baseline min cues    Status Partially Met     PT SHORT TERM GOAL #4   Title Patient will increase right single leg stance time to 30 seconds    Status On-going           PT Long Term Goals - 11/08/16 1404      PT LONG TERM GOAL #1   Title Patient will ambualte 2 miles without selfreport of pain in order to perfrom daily tasks    Time 8   Period Weeks   Status New   Target Date 01/03/17     PT LONG TERM GOAL #2   Title Patient will go up/ down 8 steps with reciprocol gait pattern without increased pain    Time 8   Period Weeks   Status New   Target Date 01/03/17     PT LONG TERM GOAL #3   Title Patient will demonstrate 5/5 gross right lower extremity strength in order to perfrom work tasks without pain    Time 8   Period Weeks   Status New   Target Date 01/03/17                Plan - 11/09/16 1053    Clinical Impression Statement After eval he had less pain and improved AROM in Rt. LE.  Asked him to proceed with caution as he moves to independence in the coming weeks.  He has 1 more visit before leaves for 6 weeks.  Needed min cues today for HEP, technique.     PT Next Visit Plan review HEP and consider adding lateral band walk, cone drill, review step ups if able; ?last visit    PT Home Exercise Plan 3 way SLR, bridging, clam, heel raise, passive knee flexion, level 2 knee: SL bridge, mini squat, hamstring stretch, high march for SLS    Consulted and Agree with Plan of Care Patient      Patient will benefit from skilled therapeutic intervention in order to improve the following deficits and impairments:  Abnormal gait, Pain, Decreased strength, Decreased endurance, Decreased activity tolerance, Difficulty walking, Decreased range of motion  Visit Diagnosis: Acute pain of right knee  Stiffness of right knee, not elsewhere classified  Difficulty in walking, not elsewhere classified     Problem List Patient Active Problem List   Diagnosis Date Noted  . Acute pain of right knee 10/17/2016  . Language barrier to communication 10/17/2016    PAA,JENNIFER 11/09/2016, 2:56 PM  [place], [place], [address] Phone: [phone]   Fax:  [phone]  Name: Dan Jimenez MRN: [id number] Date of Birth: [birth year]  Raeford Razor, PT 11/09/16 2:56 PM Phone: [phone] Fax: [phone]

## 2016-11-10 ENCOUNTER — Encounter: Payer: Self-pay | Admitting: Sports Medicine

## 2016-11-10 ENCOUNTER — Ambulatory Visit (INDEPENDENT_AMBULATORY_CARE_PROVIDER_SITE_OTHER): Payer: Self-pay | Admitting: Sports Medicine

## 2016-11-10 VITALS — BP 120/90

## 2016-11-10 DIAGNOSIS — M25561 Pain in right knee: Secondary | ICD-10-CM

## 2016-11-11 NOTE — Progress Notes (Signed)
   Subjective:    Patient ID: Dan Jimenez, male    DOB: March 13, 1976, 41 y.o.   MRN: [id number]  HPI   Patient comes in today earlier than expected. He has been diagnosed with a partial anterior cruciate ligament tear of the right knee. He has started physical therapy and is feeling much better. He has no pain. He is getting ready to leave town for a couple of months. He will be traveling out of the country. He would like a note to take with him to give to his physicians [place] so that he can continue his physical therapy.    Review of Systems As above    Objective:   Physical Exam  Well-developed, well-nourished. No acute distress.  Right knee: Full range of motion. There is no effusion. No soft tissue swelling. There is a mild amount of laxity(1+) with anterior drawer and Lachman testing but a good solid endpoint is felt. PCL is intact. Knee is stable to valgus and varus stressing. No joint line tenderness to palpation. Negative McMurray's. Negative Thessaly's. Neurovascularly intact distally. Walking without a limp.      Assessment & Plan:   Partial anterior cruciate ligament tear, right knee  I'll provide the patient with a note with his diagnosis and my recommendation to continue with physical therapy for a total of 8-10 weeks. He will take this to his physicians [place]. It is important for him to develop and maintain strength in his right knee to help provide stability for his partial anterior cruciate ligament tear. He understands. I also discussed bracing when he decides to return to playing soccer. He will [place]

## 2016-11-14 ENCOUNTER — Ambulatory Visit: Payer: Self-pay | Admitting: Sports Medicine

## 2016-11-14 ENCOUNTER — Ambulatory Visit: Payer: Self-pay | Admitting: Physical Therapy

## 2016-11-14 ENCOUNTER — Encounter: Payer: Self-pay | Admitting: Physical Therapy

## 2016-11-14 DIAGNOSIS — M25661 Stiffness of right knee, not elsewhere classified: Secondary | ICD-10-CM

## 2016-11-14 DIAGNOSIS — R262 Difficulty in walking, not elsewhere classified: Secondary | ICD-10-CM

## 2016-11-14 DIAGNOSIS — M25561 Pain in right knee: Secondary | ICD-10-CM

## 2016-11-14 NOTE — Therapy (Signed)
[place], [place], [address] Phone: [phone]   Fax:  [phone]  Physical Therapy Treatment  Patient Details  Name: Dan Jimenez MRN: [id number] Date of Birth: [birth year] Referring Provider: Dr Lilia Argue   Encounter Date: 11/14/2016      PT End of Session - 11/14/16 1515    Visit Number 3   Number of Visits 16   Date for PT Re-Evaluation 01/03/17   PT Start Time 1093   PT Stop Time 1514   PT Time Calculation (min) 57 min   Activity Tolerance Patient tolerated treatment well   Behavior During Therapy [facility] for tasks assessed/performed      Past Medical History:  Diagnosis Date  . Language barrier to communication     Past Surgical History:  Procedure Laterality Date  . MIDDLE EAR SURGERY Left 05/26/2008    There were no vitals filed for this visit.      Subjective Assessment - 11/14/16 1420    Subjective Minimal pain with end range stretching.  Wants more exetrcises   Patient is accompained by: Interpreter   Currently in Pain? Yes   Pain Score --  minimal   Pain Location Knee   Pain Orientation Right;Anterior;Distal   Pain Descriptors / Indicators Sore;Aching   Pain Type Acute pain   Pain Frequency Intermittent   Aggravating Factors  end range stretch   Pain Relieving Factors ice,  change of position   Effect of Pain on Daily Activities not yet palying soccer                         [facility] Adult PT Treatment/Exercise - 11/14/16 0001      Knee/Hip Exercises: Aerobic   Stationary Bike 8 minutes, various levels    2 miles     Knee/Hip Exercises: Machines for Strengthening   Cybex Knee Flexion 25, 35. 45 x 10 each ,  both     Knee/Hip Exercises: Standing   Hip Flexion 1 set;10 reps   Hip Flexion Limitations red band 10 X   Terminal Knee Extension Limitations 3 positions red band 10 X each HEP   Hip ADduction 1 set;10 reps   Hip ADduction  Limitations red,  HEP   Hip Abduction 1 set   Hip Extension 2 sets   Extension Limitations red band,  HEP     Cryotherapy   Number Minutes Cryotherapy 10 Minutes   Cryotherapy Location Knee   Type of Cryotherapy --  cold pack                PT Education - 11/14/16 1515    Education provided Yes   Education Details HEP   Person(s) Educated Patient   Methods Explanation;Demonstration;Tactile cues;Verbal cues;Handout   Comprehension Verbalized understanding;Returned demonstration          PT Short Term Goals - 11/09/16 1454      PT SHORT TERM GOAL #1   Title Patient will demsotrate 130 degrees of passive knee flexion    Baseline 130 deg AROM    Status Achieved     PT SHORT TERM GOAL #2   Title Patient will increase gross right LE strength to 4+/5    Status Unable to assess     PT SHORT TERM GOAL #3   Title Patient will be independent with inital HEP dfor quad strength and stability    Baseline min cues    Status Partially  Met     PT SHORT TERM GOAL #4   Title Patient will increase right single leg stance time to 30 seconds    Status On-going           PT Long Term Goals - 11/08/16 1404      PT LONG TERM GOAL #1   Title Patient will ambualte 2 miles without selfreport of pain in order to perfrom daily tasks    Time 8   Period Weeks   Status New   Target Date 01/03/17     PT LONG TERM GOAL #2   Title Patient will go up/ down 8 steps with reciprocol gait pattern without increased pain    Time 8   Period Weeks   Status New   Target Date 01/03/17     PT LONG TERM GOAL #3   Title Patient will demonstrate 5/5 gross right lower extremity strength in order to perfrom work tasks without pain    Time 8   Period Weeks   Status New   Target Date 01/03/17               Plan - 11/14/16 1516    Clinical Impression Statement No pain at end of sesssion,  no new goals met however progress toward HEP goal.  138 AA knee flexzion.    PT  Treatment/Interventions ADLs/Self Care Home Management;Cryotherapy;Iontophoresis 25m/ml Dexamethasone;Gait training;Traction;Moist Heat;Therapeutic activities;Therapeutic exercise;Neuromuscular re-education;Patient/family education;Passive range of motion;Manual techniques;Splinting;Taping   PT Next Visit Plan review HEP and consider adding lateral band walk, cone drill, review step ups if able; ?last visit    PT Home Exercise Plan 3 way SLR, bridging, clam, heel raise, passive knee flexion, level 2 knee: SL bridge, mini squat, hamstring stretch, high march for SLS ,  terminal knee and hip 4 way with bands.   Consulted and Agree with Plan of Care Patient      Patient will benefit from skilled therapeutic intervention in order to improve the following deficits and impairments:  Abnormal gait, Pain, Decreased strength, Decreased endurance, Decreased activity tolerance, Difficulty walking, Decreased range of motion  Visit Diagnosis: Acute pain of right knee  Stiffness of right knee, not elsewhere classified  Difficulty in walking, not elsewhere classified     Problem List Patient Active Problem List   Diagnosis Date Noted  . Acute pain of right knee 10/17/2016  . Language barrier to communication 10/17/2016    [facility] 11/14/2016, 3:20 PM  [place] [id number]: [phone]  Fax:  [phone] Name: MURBAN NAVALMRN: [id number] of Birth: [birth year]

## 2016-11-14 NOTE — Patient Instructions (Signed)
From ex drawer,  Knee control standing, 1-2 x  A day 10-30 X ,  3 positions  ( terminal Knee)  Hip 4 way bands 10 -30 x each,  Red band

## 2016-11-15 ENCOUNTER — Ambulatory Visit: Payer: No Typology Code available for payment source | Admitting: Physical Therapy

## 2016-11-15 ENCOUNTER — Ambulatory Visit: Payer: Self-pay | Admitting: Physical Therapy

## 2016-11-15 ENCOUNTER — Encounter: Payer: Self-pay | Admitting: Physical Therapy

## 2016-11-15 ENCOUNTER — Encounter: Payer: No Typology Code available for payment source | Admitting: Physical Therapy

## 2016-11-15 DIAGNOSIS — M25561 Pain in right knee: Secondary | ICD-10-CM

## 2016-11-15 DIAGNOSIS — R262 Difficulty in walking, not elsewhere classified: Secondary | ICD-10-CM

## 2016-11-15 DIAGNOSIS — M25661 Stiffness of right knee, not elsewhere classified: Secondary | ICD-10-CM

## 2016-11-15 NOTE — Therapy (Addendum)
[place], [place], [address] Phone: [phone]   Fax:  [phone]  Physical Therapy Treatment/ Discharge  Patient Details  Name: Dan Jimenez MRN: [id number] Date of Birth: [birth year] Referring Provider: Dr Lilia Argue   Encounter Date: 11/15/2016      PT End of Session - 11/15/16 1519    Visit Number 4   Number of Visits 16   Date for PT Re-Evaluation 01/03/17   Authorization Type CAFA   PT Start Time 1500   PT Stop Time 1543   PT Time Calculation (min) 43 min   Activity Tolerance Patient tolerated treatment well   Behavior During Therapy [facility] for tasks assessed/performed      Past Medical History:  Diagnosis Date  . Language barrier to communication     Past Surgical History:  Procedure Laterality Date  . MIDDLE EAR SURGERY Left 05/26/2008    There were no vitals filed for this visit.      Subjective Assessment - 11/15/16 1509    Subjective Patient feels like his knee is doing well. He will be going to [place] over the next few weeks and will not be back for a few months.    Limitations Standing;Walking;House hold activities   How long can you sit comfortably? No limit    How long can you stand comfortably? > 45 minutes    How long can you walk comfortably? pain after about a mile    Diagnostic tests right knee MRI 50 % tear of the ACL    Currently in Pain? No/denies                         [facility] Adult PT Treatment/Exercise - 11/15/16 0001      Knee/Hip Exercises: Aerobic   Stationary Bike 5 min      Knee/Hip Exercises: Supine   Bridges Limitations 2 x 10    Straight Leg Raises Limitations 2x20      Modalities   Modalities Cryotherapy     Cryotherapy   Number Minutes Cryotherapy 10 Minutes   Cryotherapy Location Knee   Type of Cryotherapy Ice pack                PT Education - 11/15/16 1516    Education provided Yes   Education  Details Updated HEP for higher level exercises.    Person(s) Educated Patient   Methods Explanation;Demonstration;Tactile cues;Verbal cues   Comprehension Verbalized understanding;Returned demonstration          PT Short Term Goals - 11/15/16 2127      PT SHORT TERM GOAL #1   Title Patient will demsotrate 130 degrees of passive knee flexion    Baseline 130 deg AROM    Time 4   Period Weeks   Status Achieved     PT SHORT TERM GOAL #2   Title Patient will increase gross right LE strength to 4+/5    Baseline 4+/5    Time 4   Period Weeks   Status Achieved     PT SHORT TERM GOAL #3   Title Patient will be independent with inital HEP dfor quad strength and stability    Baseline min cues    Time 4   Period Weeks   Status Achieved     PT SHORT TERM GOAL #4   Title Patient will increase right single leg stance time to 30 seconds    Baseline perfromed  single leg stance tasks today.    Time 4   Period Weeks   Status On-going           PT Long Term Goals - 11/15/16 2129      PT LONG TERM GOAL #1   Title Patient will ambualte 2 miles without selfreport of pain in order to perfrom daily tasks    Baseline Patient will discharge early 2nd to travel    Time 8   Period Weeks   Status On-going     PT LONG TERM GOAL #2   Title Patient will go up/ down 8 steps with reciprocol gait pattern without increased pain    Baseline Patient will discharge early 2nd to travel reviewed steps    Period Weeks   Status On-going     PT LONG TERM GOAL #3   Title Patient will demonstrate 5/5 gross right lower extremity strength in order to perfrom work tasks without pain    Time 8   Period Weeks   Status On-going               Plan - 11/15/16 2121    Clinical Impression Statement Given advanced exercises. Advised through interpreter to begin when he is comfrtable with other exercises. 130 degrees of knee flexion noted. Patient has a good plan for continued progression of his  paln. Therapy reviewed proper squatting technique with the patient. Patient tolerated well.    Clinical Presentation Stable   Clinical Decision Making Low   Rehab Potential Good   PT Frequency 2x / week   PT Duration 8 weeks   PT Treatment/Interventions ADLs/Self Care Home Management;Cryotherapy;Iontophoresis 71m/ml Dexamethasone;Gait training;Traction;Moist Heat;Therapeutic activities;Therapeutic exercise;Neuromuscular re-education;Patient/family education;Passive range of motion;Manual techniques;Splinting;Taping   PT Next Visit Plan review HEP and consider adding lateral band walk, cone drill, review step ups if able; ?last visit    PT Home Exercise Plan 3 way SLR, bridging, clam, heel raise, passive knee flexion, level 2 knee: SL bridge, mini squat, hamstring stretch, high march for SLS ,  terminal knee and hip 4 way with bands.   Consulted and Agree with Plan of Care Patient      Patient will benefit from skilled therapeutic intervention in order to improve the following deficits and impairments:  Abnormal gait, Pain, Decreased strength, Decreased endurance, Decreased activity tolerance, Difficulty walking, Decreased range of motion  Visit Diagnosis: Acute pain of right knee  Stiffness of right knee, not elsewhere classified  Difficulty in walking, not elsewhere classified  PHYSICAL THERAPY DISCHARGE SUMMARY  Visits from Start of Care:4  Current functional level related to goals / functional outcomes: Walking without pain    Remaining deficits: Continued weakness but was leaving the country    Education / Equipment: HEP Plan: Patient agrees to discharge.  Patient goals were not met. Patient is being discharged due to being pleased with the current functional level.  ?????       Problem List Patient Active Problem List   Diagnosis Date Noted  . Acute pain of right knee 10/17/2016  . Language barrier to communication 10/17/2016    DCarney LivingPT DPT 11/15/2016,  9:34 PM  [facility] [place] [place] [address]Phone: [phone]  Fax:  [phone] Name: MSID GREENERMRN: [id number] of Birth: [birth year]

## 2016-12-01 ENCOUNTER — Ambulatory Visit: Payer: No Typology Code available for payment source | Admitting: Sports Medicine

## 2017-05-04 ENCOUNTER — Ambulatory Visit (INDEPENDENT_AMBULATORY_CARE_PROVIDER_SITE_OTHER): Payer: Self-pay | Admitting: Sports Medicine

## 2017-05-04 VITALS — BP 132/80 | Ht 68.0 in | Wt 180.0 lb

## 2017-05-04 DIAGNOSIS — Z87828 Personal history of other (healed) physical injury and trauma: Secondary | ICD-10-CM

## 2017-05-04 NOTE — Progress Notes (Signed)
Today's interpreter is Virgel BouquetJameel Ali.

## 2017-05-04 NOTE — Progress Notes (Signed)
   [facility] Family Dan Dan Dan CharsAsiyah Aimee Timmons, MD Phone: [phone]  Reason For Visit: Follow-up  #Patient presenting following injury 6 months ago with a partial tear of his anterior cruciate ligament in his right knee.  Patient was last seen in July 2018 and was [place].  He received about 8 weeks of physical therapy and felt significantly better. Patient only has about 1 out of 10 pain in his knee at times.  He does note some grinding in his right knee in the afternoon after activity, however denies any locking.  Patient has been active walking without any issues.  Would like to know if he could get back to playing soccer.  Past Medical History Reviewed problem list.  Medications- reviewed and updated No additions to family history Social history- patient is a non-smoker  Objective: BP 132/80   Ht 5\' 8"  (1.727 m)   Wt 180 lb (81.6 kg)   BMI 27.37 kg/m  Gen: NAD, alert, cooperative with exam Knee : No abnormalities noted on inspection of either knee.  Right knee without pain with palpation along the joint line laterally and medially.  Normal range of motion, slight increase instability in the right knee with Lachman's and anterior drawer compared to left knee, 5 out of 5 strength in lower extremities, neurovascularly intact,  Extremities: warm, well perfused, No edema, cyanosis or clubbing;  Skin: dry, intact, no rashes or lesions    Assessment/Plan: See problem based a/p  1. Status post tear of anterior cruciate ligament Partial anterior cruciate ligament tear of the right knee diagnosed back in July.  Improved though patient still with some laxity in right knee here for follow-up as patient was interested in restarting soccer.  After a long discussion about risk associated with soccer patient decided that he would likely continue activity with running or biking.  Discussed using a brace for activity patient would like to hold off on this and would prefer  a prescription as he is self-pay.  Patient seen and evaluated with the resident. I agree with the above plan of care. Patient is doing very well. His strength is very good in the right knee. He does have laxity with both anterior drawer and Lachman's testing but there is an endpoint. Patient has decided not to resume soccer which I think is a good decision. He would be at high risk of reinjuring his anterior cruciate ligament. He is going to stick with activity such as running or cycling for exercise. I recommend that he get a simple knee compression sleeve to wear when active. He will continue his home exercises to keep his right knee and leg strong. Follow-up with me as needed.

## 2017-05-08 ENCOUNTER — Ambulatory Visit: Payer: Self-pay | Attending: Internal Medicine

## 2019-03-30 IMAGING — CR DG KNEE COMPLETE 4+V*R*
4 series · 4 of 4 positions shown · non-contrast
Comparison: None.

CLINICAL DATA: Soccer injury yesterday with right knee pain and
swelling.

EXAM:
RIGHT KNEE - COMPLETE 4+ VIEW

[knee ap]
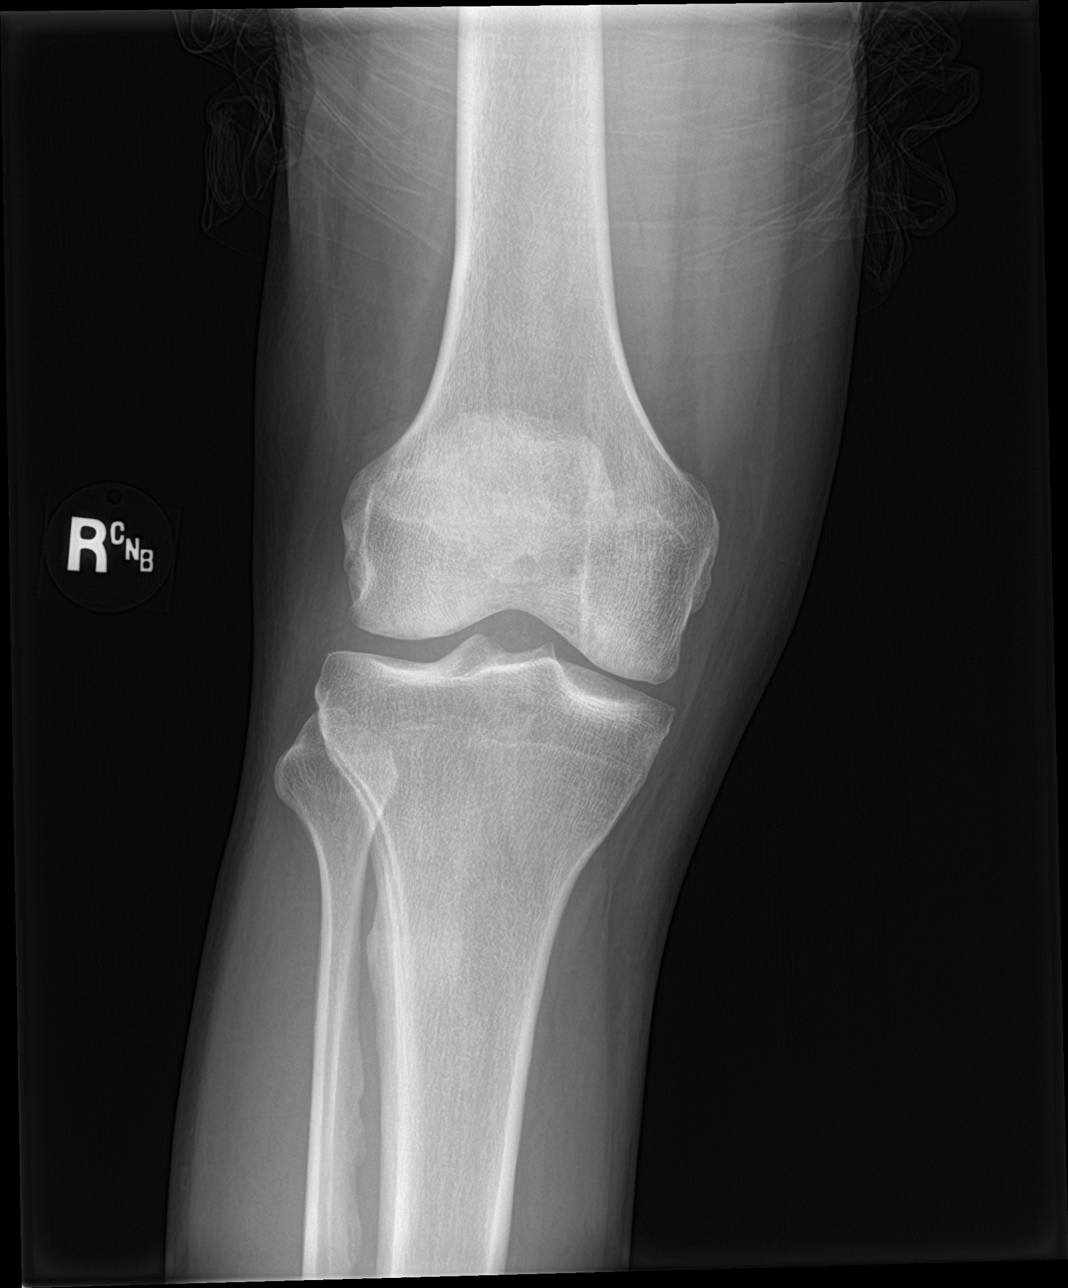

[knee lat]
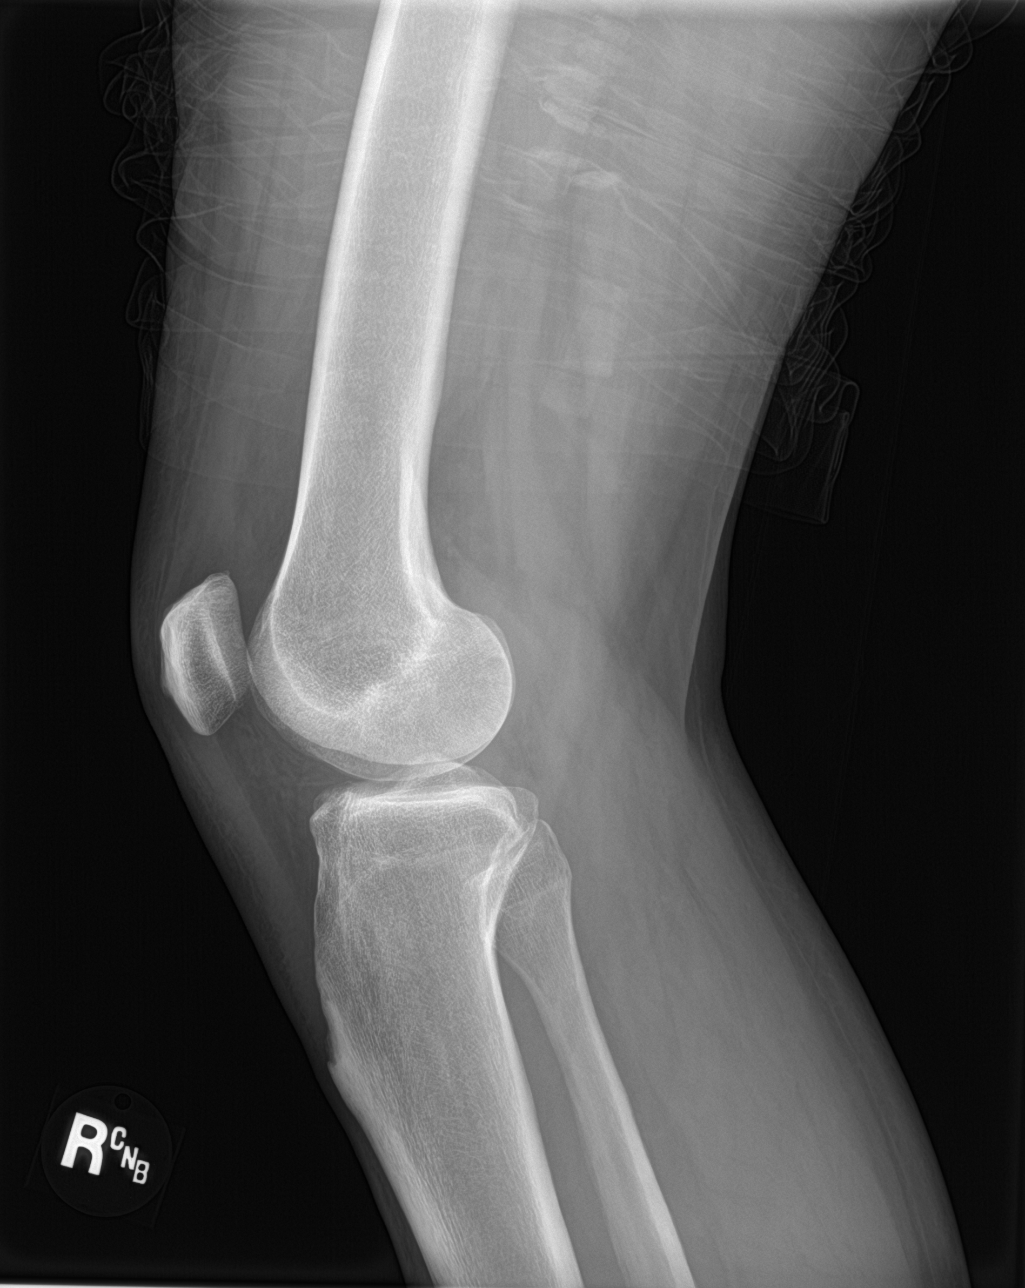

[knee obl (1 of 2)]
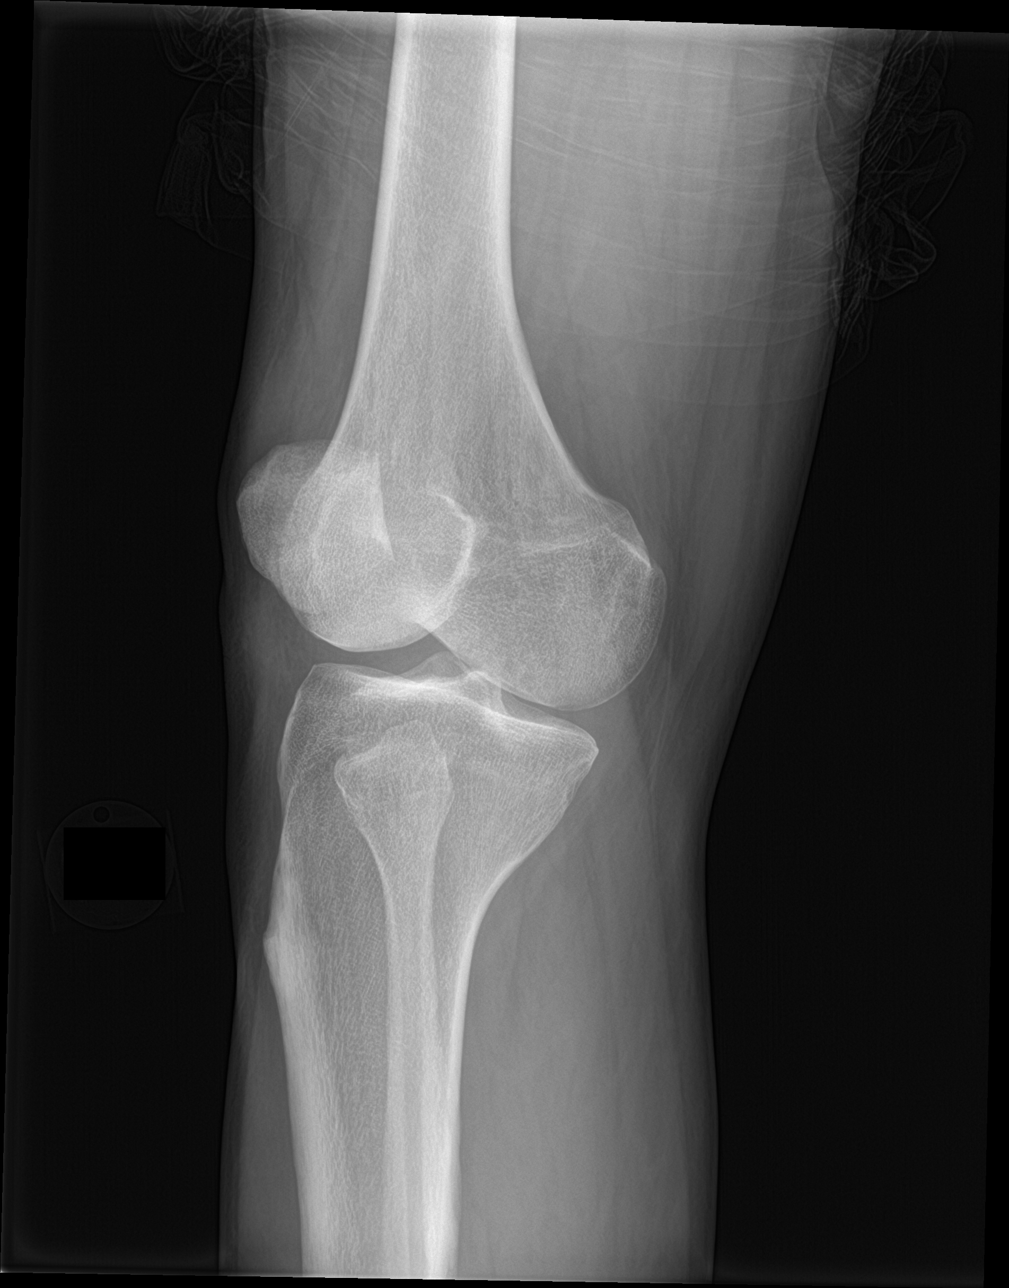

[knee obl (2 of 2)]
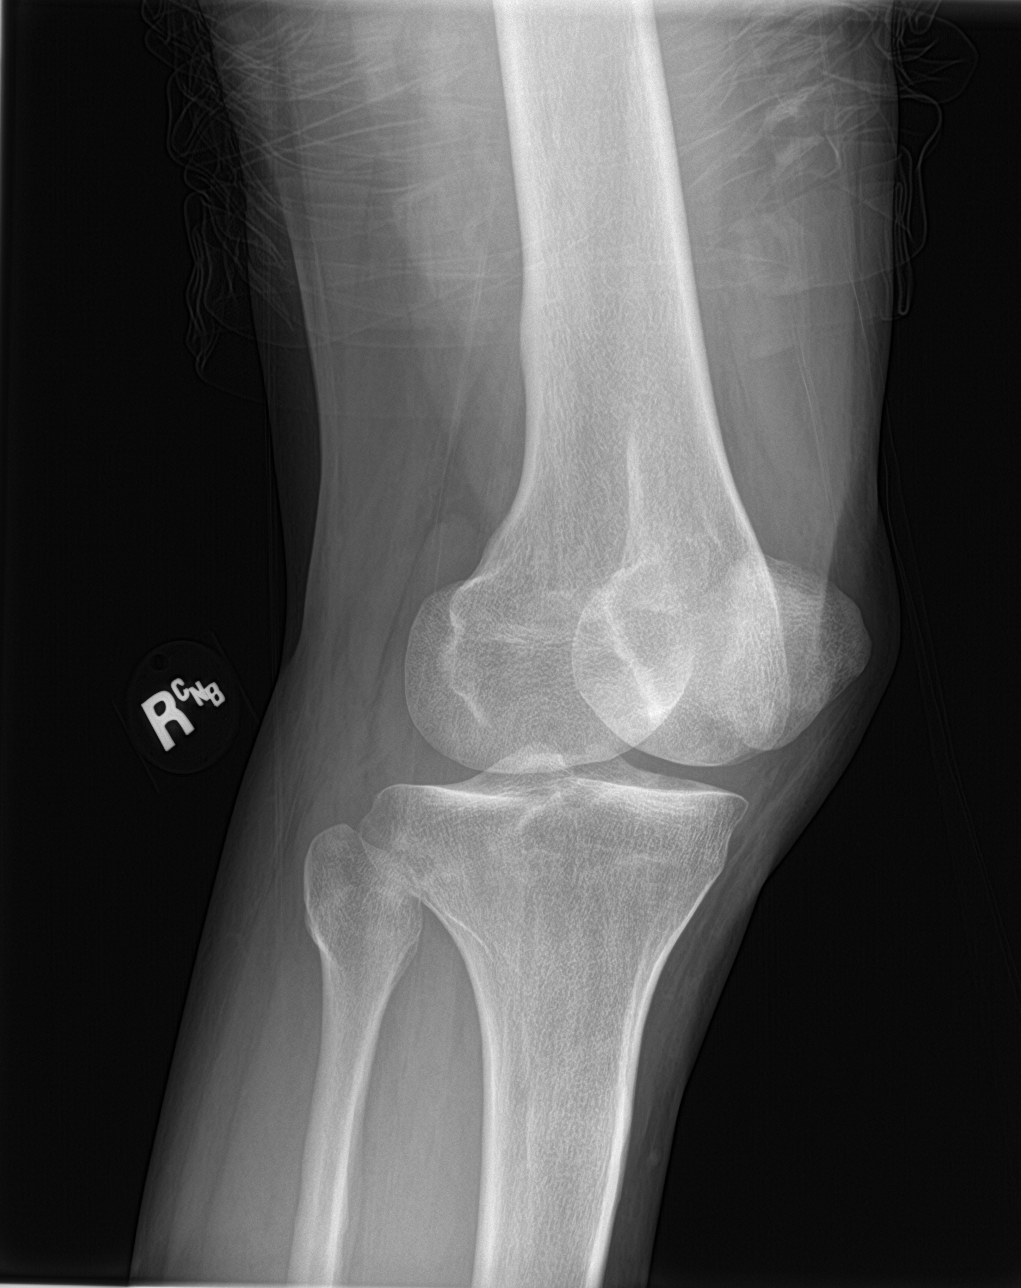

[4 of 4 positions shown; findings below may reference images not displayed]

FINDINGS: No evidence of fracture, dislocation, or joint effusion. No evidence
of arthropathy or other focal bone abnormality. Soft tissues are
unremarkable.
IMPRESSION: Negative.

## 2019-05-16 ENCOUNTER — Telehealth: Payer: Self-pay | Admitting: Family Medicine

## 2019-05-16 NOTE — Telephone Encounter (Signed)
Pt was informed that need to cancel the Financial appt since last time he saw the PCP was 10/2016 need to schedule a PCP appt then schedule a financial

## 2019-05-29 ENCOUNTER — Ambulatory Visit: Payer: Self-pay

## 2019-11-12 ENCOUNTER — Other Ambulatory Visit: Payer: Self-pay

## 2019-11-12 ENCOUNTER — Ambulatory Visit: Payer: 59 | Admitting: Sports Medicine

## 2019-11-12 VITALS — BP 118/82 | Ht 70.08 in | Wt 187.4 lb

## 2019-11-12 DIAGNOSIS — M6530 Trigger finger, unspecified finger: Secondary | ICD-10-CM | POA: Diagnosis not present

## 2019-11-12 DIAGNOSIS — M25561 Pain in right knee: Secondary | ICD-10-CM

## 2019-11-12 MED ORDER — MELOXICAM 15 MG PO TABS: ORAL_TABLET | ORAL | 0 refills | Status: AC

## 2019-11-12 NOTE — Patient Instructions (Signed)
It was great to meet you today! Thank you for letting me participate in your care!  Today, we discussed your right knee pain and at this time we do not recommend surgery. You do have some weakness in your right leg so we suggest you wear the knee sleeve when you are active and you go back to physical therapy to build up your strength.  You also have what is called a Trigger Finger. More info on it is below. We have prescribed you a medicine to help it. Continue to wear the finger sleeve at night time. If it is NOT better in 2 WEEKS please return to the clinic.  Be well, Jules Schick, DO PGY-4, Sports Medicine Fellow [facility]   Trigger Finger  Trigger finger, also called stenosing tenosynovitis,  is a condition that causes a finger to get stuck in a bent position. Each finger has a tendon, which is a tough, cord-like tissue that connects muscle to bone, and each tendon passes through a tunnel of tissue called a tendon sheath. To move your finger, your tendon needs to glide freely through the sheath. Trigger finger happens when the tendon or the sheath thickens, making it difficult to move your finger. Trigger finger can affect any finger or a thumb. It may affect more than one finger. Mild cases may clear up with rest and medicine. Severe cases require more treatment. What are the causes? Trigger finger is caused by a thickened finger tendon or tendon sheath. The cause of this thickening is not known. What increases the risk? The following factors may make you more likely to develop this condition:  Doing activities that require a strong grip.  Having rheumatoid arthritis, gout, or diabetes.  Being 72-34 years old.  Being male. What are the signs or symptoms? Symptoms of this condition include:  Pain when bending or straightening your finger.  Tenderness or swelling where your finger attaches to the palm of your hand.  A lump in the palm of your hand or on the  inside of your finger.  Hearing a noise like a pop or a snap when you try to straighten your finger.  Feeling a catching or locking sensation when you try to straighten your finger.  Being unable to straighten your finger. How is this diagnosed? This condition is diagnosed based on your symptoms and a physical exam. How is this treated? This condition may be treated by:  Resting your finger and avoiding activities that make symptoms worse.  Wearing a finger splint to keep your finger extended.  Taking NSAIDs, such as ibuprofen, to relieve pain and swelling.  Doing gentle exercises to stretch the finger as told by your health care provider.  Having medicine that reduces swelling and inflammation (steroids) injected into the tendon sheath. Injections may need to be repeated.  Having surgery to open the tendon sheath. This may be done if other treatments do not work and you cannot straighten your finger. You may need physical therapy after surgery. Follow these instructions at home: If you have a splint:  Wear the splint as told by your health care provider. Remove it only as told by your health care provider.  Loosen it if your fingers tingle, become numb, or turn cold and blue.  Keep it clean.  If the splint is not waterproof: ? Do not let it get wet. ? Cover it with a watertight covering when you take a bath or shower. Managing pain, stiffness, and swelling  If directed, apply heat to the affected area as often as told by your health care provider. Use the heat source that your health care provider recommends, such as a moist heat pack or a heating pad.  Place a towel between your skin and the heat source.  Leave the heat on for 20-30 minutes.  Remove the heat if your skin turns bright red. This is especially important if you are unable to feel pain, heat, or cold. You may have a greater risk of getting burned. If directed, put ice on the painful area. To do  this:  If you have a removable splint, remove it as told by your health care provider.  Put ice in a plastic bag.  Place a towel between your skin and the bag or between your splint and the bag.  Leave the ice on for 20 minutes, 2-3 times a day.  Activity  Rest your finger as told by your health care provider. Avoid activities that make the pain worse.  Return to your normal activities as told by your health care provider. Ask your health care provider what activities are safe for you.  Do exercises as told by your health care provider.  Ask your health care provider when it is safe to drive if you have a splint on your hand. General instructions  Take over-the-counter and prescription medicines only as told by your health care provider.  Keep all follow-up visits as told by your health care provider. This is important. Contact a health care provider if:  Your symptoms are not improving with home care. Summary  Trigger finger, also called stenosing tenosynovitis, causes your finger to get stuck in a bent position. This can make it difficult and painful to straighten your finger.  This condition develops when a finger tendon or tendon sheath thickens.  Treatment may include resting your finger, wearing a splint, and taking medicines.  In severe cases, surgery to open the tendon sheath may be needed. This information is not intended to replace advice given to you by your health care provider. Make sure you discuss any questions you have with your health care provider. Document Revised: 08/20/2018 Document Reviewed: 08/20/2018 Elsevier Patient Education  2020 [employer].

## 2019-11-12 NOTE — Assessment & Plan Note (Signed)
Patient with history of partial ACL tear diagnosed in 2018 with MRI.  The patient is not unstable and that his pain is mild and that he does have observable quadriceps muscle weakness on exam we will pursue conservative management at this time. -Compression sleeve for the right knee to be worn when active -Physical therapy for quadriceps strengthening -Follow-up in 6 weeks; x-rays for the right knee if no improvement and then we will most likely pursue repeat MRI

## 2019-11-12 NOTE — Progress Notes (Signed)
SUBJECTIVE:   CHIEF COMPLAINT / HPI:   Right Finger Pain Patient states for the past 2 months he has noticed his right finger is sometimes painful, more at night during the day.  He has had no known trauma or significant injury to this finger but does state that he repetitively carries heavy objects for his work that does put strain on his fingers and hands.  He states that sometimes when he makes a fist he cannot fully extend the right fourth digit.  He also states that when he wakes up in the morning if he does not wear a sleeve to keep his finger straight he will wake up and his finger will stay locked in a flexed position and he cannot extend it on its own.  He states that it is painful when it does go from flexion to extension but otherwise does not bother him too much.  Never had this before.  Right Knee Pain Patient has a history of a partial right ACL tear in 2018 and he was seen and evaluated by Dr. Margaretha Sheffield in this practice at that time.  He has not been seen since as he did go out of the country for several months.  He did do physical therapy rehab for this issue and states that he had good response.  Over the past few months however he has noticed that he feels like his leg is slightly painful.  It is more painful when he is standing or when he is more active for long periods of time typically 6 hours or more.  He states that he does not feel unstable he has not had any clicking, locking, or popping of the right knee.  He states that the pain is mainly the top of his knee.  PERTINENT  PMH / PSH: Hx of right partial ACL tear  OBJECTIVE:   BP 118/82   Ht 5' 10.08" (1.78 m)   Wt 187 lb 6.3 oz (85 kg)   BMI 26.83 kg/m   MSK: Wrist, Right: Inspection yielded no erythema, ecchymosis, bony deformity, or swelling.  Patient has active triggering of the ring finger.. Palpation is normal over metacarpals without tenderness or swelling. No palpable nodule located at the MCP joint of the 4th  digit but he is slightly tender in that region to palpation. Strength 5/5 in all directions without pain. Gross sensation intact. Knee, Right: Inspection was negative for erythema, ecchymosis, and effusion. No obvious bony abnormalities or signs of osteophyte development. Palpation yielded no asymmetric warmth; He is tender above the patella medial and lateral to the quad tendon, No joint line tenderness; No condyle tenderness; No patellar tenderness; No patellar crepitus. Quadriceps muscle did exhibit quivering on extension, and no tenderness of the pes anserine bursa. No obvious Baker's cyst development. ROM normal in flexion (120 degrees) and extension (0 degrees). Normal hamstring but slightly weak quadriceps strength. Neurovascularly intact bilaterally. Special Tests  - Cruciate Ligaments:   - Anterior Drawer:  NEG - Posterior Drawer: NEG   - Varus/Valgus Stress test: NEG  - Meniscus:   - Thessaly: NEG   - McMurray's: Equivocal   - Patella:   - Patellar grind/compression: NEG   ASSESSMENT/PLAN:   Trigger finger, acquired Did offer steroid injection for trigger finger today but patient declines today and elects to try medicaiton - Meloxicam 15mg  for 2 weeks to be taken with food - Cont to wear finger sleeve at night - F/u in 2 weeks if no improvement  Right knee pain Patient with history of partial ACL tear diagnosed in 2018 with MRI.  The patient is not unstable and that his pain is mild and that he does have observable quadriceps muscle weakness on exam we will pursue conservative management at this time. -Compression sleeve for the right knee to be worn when active -Physical therapy for quadriceps strengthening -Follow-up in 6 weeks; x-rays for the right knee if no improvement and then we will most likely pursue repeat MRI     Arlyce Harman, DO [facility]   Patient seen and evaluated with the sports medicine fellow.  I agree with the above plan of  care.  We did offer a cortisone injection for the patient's trigger finger today but he would like to try oral anti-inflammatories first.  If he continues to have triggering despite 2 weeks of meloxicam, that he should reconsider cortisone injection.  In regards to his right knee, he has a documented history of a partial ACL tear which is done well with physical therapy in the past.  His complaint today is pain with prolonged standing and walking.  He denies instability in the knee.  He has some quadriceps weakness on exam.  I think he would benefit from a return to physical therapy.  Follow-up with me again in 6 weeks to check on his progress.

## 2019-11-12 NOTE — Assessment & Plan Note (Signed)
Did offer steroid injection for trigger finger today but patient declines today and elects to try medicaiton - Meloxicam 15mg  for 2 weeks to be taken with food - Cont to wear finger sleeve at night - F/u in 2 weeks if no improvement

## 2019-11-26 ENCOUNTER — Ambulatory Visit (INDEPENDENT_AMBULATORY_CARE_PROVIDER_SITE_OTHER): Payer: 59

## 2019-11-26 ENCOUNTER — Other Ambulatory Visit: Payer: Self-pay

## 2019-11-26 DIAGNOSIS — Z23 Encounter for immunization: Secondary | ICD-10-CM

## 2019-11-26 NOTE — Progress Notes (Signed)
   Covid-19 Vaccination Clinic  Name:  Dan Jimenez    MRN: [id number] DOB: 06/23/1975  11/26/2019  [name] was observed post Covid-19 immunization for 15 minutes without incident. He was provided with Vaccine Information Sheet and instruction to access the V-Safe system.   [name] was instructed to call 911 with any severe reactions post vaccine: Marland Kitchen Difficulty breathing  . Swelling of face and throat  . A fast heartbeat  . A bad rash all over body  . Dizziness and weakness   Immunizations Administered    Name Date Dose VIS Date Route   Pfizer COVID-19 Vaccine 11/26/2019 12:25 PM 0.3 mL 06/12/2018 Intramuscular   Manufacturer: [employer], [employer]   Lot: [id number]   NDC: [address]-6

## 2019-11-29 ENCOUNTER — Ambulatory Visit (INDEPENDENT_AMBULATORY_CARE_PROVIDER_SITE_OTHER): Payer: 59 | Admitting: Sports Medicine

## 2019-11-29 ENCOUNTER — Other Ambulatory Visit: Payer: Self-pay

## 2019-11-29 VITALS — BP 128/82 | Ht 69.69 in | Wt 191.8 lb

## 2019-11-29 DIAGNOSIS — M6530 Trigger finger, unspecified finger: Secondary | ICD-10-CM

## 2019-11-29 MED ORDER — METHYLPREDNISOLONE ACETATE 40 MG/ML IJ SUSP
20.0000 mg | Freq: Once | INTRAMUSCULAR | Status: AC
  Administered 2019-11-29: 20 mg via INTRA_ARTICULAR

## 2019-11-29 NOTE — Assessment & Plan Note (Addendum)
Trigger finger that got a little better with oral medications but did not fully resolve. He requests injection today. He tolerated the procedure well and I am hopeful this will last him a long time. - F/u for finger as needed - I will see him in 3 weeks for f/u of his knee; I will recheck the finger at that time

## 2019-11-29 NOTE — Progress Notes (Signed)
    SUBJECTIVE:   CHIEF COMPLAINT / HPI:   Trigger Finger Dan Jimenez is a very pleasant 44 year old male who is an established patient at this practice who presents today for continued locking of the fourth digit of his right hand with known trigger finger.  He did take the meloxicam and said he thinks it helped a little but it is still locking up on him especially at nighttime and it makes it difficult to use and and sometimes becomes painful.  As we discussed an injection last time he is now open to having that today.  PERTINENT  PMH / PSH: None  OBJECTIVE:   BP 128/82   Ht 5' 9.69" (1.77 m)   Wt 191 lb 12.8 oz (87 kg)   BMI 27.77 kg/m   MSK: Right Hand: Obvious trigger finger of the fourth digit on the right hand.  No tenderness at the MCP joint, no obvious effusion, no warmth, no erythema.  He can fully flex the finger but has locking on extension.  ASSESSMENT/PLAN:   Trigger finger, acquired Trigger finger that got a little better with oral medications but did not fully resolve. He requests injection today. He tolerated the procedure well and I am hopeful this will last him a long time. - F/u for finger as needed - I will see him in 3 weeks for f/u of his knee; I will recheck the finger at that time  Procedure After discussing risks and benefits including bleeding, infection, and hypopigmentation I obtained consent. Time out to identify correct side and correct area was performed. An injection of 1/2cc lidocaine and 1/2cc depomedrol was given by Arlyce Harman. Patient instructed to remain in clinic for 10 minutes afterwards, and to report any adverse reaction to me immediately. Patient tolerated procedure well.    Arlyce Harman, DO PGY-4, Sports Medicine Fellow [facility]  Patient seen and evaluated with the sports medicine fellow.  I agree with the above plan of care.  Trigger finger injection accomplished today as above without complication.  Patient will  follow up in 3 weeks for his knee.  We will recheck the trigger finger at that time as well.  If triggering persists, consider referral to hand surgery.

## 2019-11-29 NOTE — Patient Instructions (Addendum)
It was great to meet you today! Thank you for letting me participate in your care!  Today, we discussed your trigger finger and we *injected* your finger to help bring you some relief. Hopefully, this lasts a long time but it may return. If it does return please come back and see me in the clinic.  Be well, Dan Schick, DO PGY-4, Sports Medicine Fellow [facility]   Trigger Finger  Trigger finger, also called stenosing tenosynovitis,  is a condition that causes a finger to get stuck in a bent position. Each finger has a tendon, which is a tough, cord-like tissue that connects muscle to bone, and each tendon passes through a tunnel of tissue called a tendon sheath. To move your finger, your tendon needs to glide freely through the sheath. Trigger finger happens when the tendon or the sheath thickens, making it difficult to move your finger. Trigger finger can affect any finger or a thumb. It may affect more than one finger. Mild cases may clear up with rest and medicine. Severe cases require more treatment. What are the causes? Trigger finger is caused by a thickened finger tendon or tendon sheath. The cause of this thickening is not known. What increases the risk? The following factors may make you more likely to develop this condition:  Doing activities that require a strong grip.  Having rheumatoid arthritis, gout, or diabetes.  Being 68-57 years old.  Being male. What are the signs or symptoms? Symptoms of this condition include:  Pain when bending or straightening your finger.  Tenderness or swelling where your finger attaches to the palm of your hand.  A lump in the palm of your hand or on the inside of your finger.  Hearing a noise like a pop or a snap when you try to straighten your finger.  Feeling a catching or locking sensation when you try to straighten your finger.  Being unable to straighten your finger. How is this diagnosed? This condition is  diagnosed based on your symptoms and a physical exam. How is this treated? This condition may be treated by:  Resting your finger and avoiding activities that make symptoms worse.  Wearing a finger splint to keep your finger extended.  Taking NSAIDs, such as ibuprofen, to relieve pain and swelling.  Doing gentle exercises to stretch the finger as told by your health care provider.  Having medicine that reduces swelling and inflammation (steroids) injected into the tendon sheath. Injections may need to be repeated.  Having surgery to open the tendon sheath. This may be done if other treatments do not work and you cannot straighten your finger. You may need physical therapy after surgery. Follow these instructions at home: If you have a splint:  Wear the splint as told by your health care provider. Remove it only as told by your health care provider.  Loosen it if your fingers tingle, become numb, or turn cold and blue.  Keep it clean.  If the splint is not waterproof: ? Do not let it get wet. ? Cover it with a watertight covering when you take a bath or shower. Managing pain, stiffness, and swelling     If directed, apply heat to the affected area as often as told by your health care provider. Use the heat source that your health care provider recommends, such as a moist heat pack or a heating pad.  Place a towel between your skin and the heat source.  Leave the heat on  for 20-30 minutes.  Remove the heat if your skin turns bright red. This is especially important if you are unable to feel pain, heat, or cold. You may have a greater risk of getting burned. If directed, put ice on the painful area. To do this:  If you have a removable splint, remove it as told by your health care provider.  Put ice in a plastic bag.  Place a towel between your skin and the bag or between your splint and the bag.  Leave the ice on for 20 minutes, 2-3 times a day.  Activity  Rest your  finger as told by your health care provider. Avoid activities that make the pain worse.  Return to your normal activities as told by your health care provider. Ask your health care provider what activities are safe for you.  Do exercises as told by your health care provider.  Ask your health care provider when it is safe to drive if you have a splint on your hand. General instructions  Take over-the-counter and prescription medicines only as told by your health care provider.  Keep all follow-up visits as told by your health care provider. This is important. Contact a health care provider if:  Your symptoms are not improving with home care. Summary  Trigger finger, also called stenosing tenosynovitis, causes your finger to get stuck in a bent position. This can make it difficult and painful to straighten your finger.  This condition develops when a finger tendon or tendon sheath thickens.  Treatment may include resting your finger, wearing a splint, and taking medicines.  In severe cases, surgery to open the tendon sheath may be needed. This information is not intended to replace advice given to you by your health care provider. Make sure you discuss any questions you have with your health care provider. Document Revised: 08/20/2018 Document Reviewed: 08/20/2018 Elsevier Patient Education  2020 [employer].

## 2019-12-04 ENCOUNTER — Ambulatory Visit: Payer: 59 | Attending: Sports Medicine | Admitting: Physical Therapy

## 2019-12-04 ENCOUNTER — Ambulatory Visit: Payer: Self-pay

## 2019-12-19 ENCOUNTER — Ambulatory Visit: Payer: 59 | Admitting: Sports Medicine

## 2019-12-24 ENCOUNTER — Ambulatory Visit (INDEPENDENT_AMBULATORY_CARE_PROVIDER_SITE_OTHER): Payer: 59

## 2019-12-24 VITALS — Wt 194.8 lb

## 2019-12-24 DIAGNOSIS — Z23 Encounter for immunization: Secondary | ICD-10-CM | POA: Diagnosis not present

## 2019-12-24 NOTE — Progress Notes (Signed)
   Covid-19 Vaccination Clinic  Name:  Dan Jimenez    MRN: [id number] DOB: Dec 17, 1975  12/24/2019  Dan Jimenez was observed post Covid-19 immunization for 15 minutes without incident. He was provided with Vaccine Information Sheet and instruction to access the V-Safe system.   Dan Jimenez was instructed to call 911 with any severe reactions post vaccine: Marland Kitchen Difficulty breathing  . Swelling of face and throat  . A fast heartbeat  . A bad rash all over body  . Dizziness and weakness   Immunizations Administered    Name Date Dose VIS Date Route   Pfizer COVID-19 Vaccine 12/24/2019  9:39 AM 0.3 mL 06/12/2018 Intramuscular   Manufacturer: [employer], [employer]   Lot: [id number]   NDC: [address]-2

## 2019-12-25 ENCOUNTER — Ambulatory Visit: Payer: 59 | Attending: Family Medicine | Admitting: Physical Therapy

## 2019-12-26 ENCOUNTER — Ambulatory Visit (INDEPENDENT_AMBULATORY_CARE_PROVIDER_SITE_OTHER): Payer: 59 | Admitting: Sports Medicine

## 2019-12-26 ENCOUNTER — Other Ambulatory Visit: Payer: Self-pay

## 2019-12-26 DIAGNOSIS — G8929 Other chronic pain: Secondary | ICD-10-CM | POA: Diagnosis not present

## 2019-12-26 DIAGNOSIS — M6530 Trigger finger, unspecified finger: Secondary | ICD-10-CM | POA: Diagnosis not present

## 2019-12-26 DIAGNOSIS — M25561 Pain in right knee: Secondary | ICD-10-CM

## 2019-12-26 NOTE — Assessment & Plan Note (Signed)
Patient continues to do well s/p injections.  - patient to follow up PRN

## 2019-12-26 NOTE — Patient Instructions (Signed)
Thank you for choosing [facility] for your care!   We are happy to see that your knee and finger are feeling better. Please continue to work on keeping the muscles in your thigh (quadriceps) as strong as possible to help with any knee pain.   Please return to our office as needed.

## 2019-12-26 NOTE — Assessment & Plan Note (Addendum)
Right knee pain greatly improved and patient reports being able to exercise.  - patient encouraged to continue quadriceps strengthening exercises - follow up with [facility] as needed

## 2019-12-26 NOTE — Progress Notes (Signed)
   PCP: Massie Maroon, FNP  Subjective:   HPI: Patient is a 44 y.o. male here for f/u for right knee pain and status post steroid injection for right hand trigger finger. He reports some pain for a few days following the injection that resolved and has been able to flex and extend his fingers without locking since. Patient reports that he has been doing well in regards to his knee pain. He states that he has been able to resume exercise without pain and has had improved pain while taking meloxicam.   Review of Systems:  Per HPI.       Objective:  Physical Exam:  Gen: awake, alert, NAD, comfortable in exam room sitting on exam table  Pulm: breathing unlabored  Right Hand:normal flexion and extension of phalanges, no locking appreciated on exam today, no tenderness to palpation of wrist and hand, no edema or erythema   Knee: - Inspection: no gross deformity. No swelling/effusion, erythema or bruising. Skin intact, non tender to joint line palpation  - Palpation: no TTP - ROM: full active ROM with flexion and extension in knee and hip - Strength: 5/5 strength - Neuro/vasc: NV intact - Special Tests: - LIGAMENTS: negative anterior and posterior drawer on left knee, right knee with 1+ laxity on anterior drawer test, negative Lachman's, no MCL or LCL laxity appreciated  -- MENISCUS: negative McMurray's    Assessment & Plan:    Right knee pain Right knee pain greatly improved and patient reports being able to exercise.  - patient encouraged to continue quadriceps strengthening exercises - follow up with [facility] as needed  Trigger finger, acquired Patient continues to do well s/p injections.  - patient to follow up PRN     Ronnald Ramp, MD [facility], PGY2 12/26/2019 9:27 AM  Patient seen and evaluated with the resident.  I agree with the above plan of care.  Patient's trigger finger has resolved after recent cortisone injection.  Right knee pain also  resolved with treatment to date.  He understands the importance of continuing to keep both hamstrings and quadriceps muscles strong.  I will discharge him from my care to follow-up as needed.

## 2020-05-13 ENCOUNTER — Other Ambulatory Visit: Payer: 59

## 2020-05-13 DIAGNOSIS — Z20822 Contact with and (suspected) exposure to covid-19: Secondary | ICD-10-CM

## 2020-05-15 LAB — SARS-COV-2, NAA 2 DAY TAT

## 2020-05-15 LAB — NOVEL CORONAVIRUS, NAA: SARS-CoV-2, NAA: NOT DETECTED

## 2020-05-21 ENCOUNTER — Ambulatory Visit: Payer: 59

## 2021-11-29 ENCOUNTER — Ambulatory Visit: Payer: Self-pay | Admitting: Family Medicine

## 2021-12-21 DIAGNOSIS — H90A21 Sensorineural hearing loss, unilateral, right ear, with restricted hearing on the contralateral side: Secondary | ICD-10-CM | POA: Diagnosis not present

## 2021-12-23 ENCOUNTER — Ambulatory Visit (INDEPENDENT_AMBULATORY_CARE_PROVIDER_SITE_OTHER): Payer: 59 | Admitting: Family Medicine

## 2021-12-23 ENCOUNTER — Encounter: Payer: Self-pay | Admitting: Family Medicine

## 2021-12-23 VITALS — BP 126/82 | HR 77 | Temp 97.8°F | Ht 70.0 in | Wt 191.0 lb

## 2021-12-23 DIAGNOSIS — H6991 Unspecified Eustachian tube disorder, right ear: Secondary | ICD-10-CM | POA: Insufficient documentation

## 2021-12-23 DIAGNOSIS — H6981 Other specified disorders of Eustachian tube, right ear: Secondary | ICD-10-CM

## 2021-12-23 DIAGNOSIS — Z0000 Encounter for general adult medical examination without abnormal findings: Secondary | ICD-10-CM

## 2021-12-23 LAB — COMPREHENSIVE METABOLIC PANEL
ALT: 15 U/L (ref 0–53)
AST: 14 U/L (ref 0–37)
Albumin: 4.3 g/dL (ref 3.5–5.2)
Alkaline Phosphatase: 56 U/L (ref 39–117)
BUN: 16 mg/dL (ref 6–23)
CO2: 26 mEq/L (ref 19–32)
Calcium: 9.8 mg/dL (ref 8.4–10.5)
Chloride: 103 mEq/L (ref 96–112)
Creatinine, Ser: 0.93 mg/dL (ref 0.40–1.50)
GFR: 98.22 mL/min (ref >60.00)
Glucose, Bld: 101 mg/dL — ABNORMAL HIGH (ref 70–99)
Potassium: 3.6 mEq/L (ref 3.5–5.1)
Sodium: 138 mEq/L (ref 135–145)
Total Bilirubin: 0.4 mg/dL (ref 0.2–1.2)
Total Protein: 7.4 g/dL (ref 6.0–8.3)

## 2021-12-23 LAB — URINALYSIS, ROUTINE W REFLEX MICROSCOPIC
Bilirubin Urine: NEGATIVE
Hgb urine dipstick: NEGATIVE
Ketones, ur: NEGATIVE
Leukocytes,Ua: NEGATIVE
Nitrite: NEGATIVE
RBC / HPF: NONE SEEN (ref 0–?)
Specific Gravity, Urine: >=1.03 — AB (ref 1.000–1.030)
Total Protein, Urine: NEGATIVE
Urine Glucose: NEGATIVE
Urobilinogen, UA: 0.2 (ref 0.0–1.0)
pH: 6 (ref 5.0–8.0)

## 2021-12-23 LAB — CBC
HCT: 41.3 % (ref 39.0–52.0)
Hemoglobin: 13.3 g/dL (ref 13.0–17.0)
MCHC: 32.3 g/dL (ref 30.0–36.0)
MCV: 76.3 fl — ABNORMAL LOW (ref 78.0–100.0)
Platelets: 238 10*3/uL (ref 150.0–400.0)
RBC: 5.42 Mil/uL (ref 4.22–5.81)
RDW: 14.8 % (ref 11.5–15.5)
WBC: 9.6 10*3/uL (ref 4.0–10.5)

## 2021-12-23 LAB — LIPID PANEL
Cholesterol: 209 mg/dL — ABNORMAL HIGH (ref 0–200)
HDL: 47.9 mg/dL (ref >39.00)
LDL Cholesterol: 141 mg/dL — ABNORMAL HIGH (ref 0–99)
NonHDL: 160.9
Total CHOL/HDL Ratio: 4
Triglycerides: 98 mg/dL (ref 0.0–149.0)
VLDL: 19.6 mg/dL (ref 0.0–40.0)

## 2021-12-23 NOTE — Progress Notes (Signed)
New Patient Office Visit  Subjective    Patient ID: Dan Jimenez, male    DOB: Jun 05, 1975  Age: 46 y.o. MRN: [id number]  CC:  Chief Complaint  Patient presents with  . Establish Care    NP/establish care check right ear unable to hear.     HPI Dan Jimenez presents to establish care And physical exam.  He is fasting.  He has no chronic health issues.  He exercises by walking on occasion.  He has no regular dental care.  He lives with his wife and 4 children.  He does not drink alcohol, smoke or use illicit drugs.  He drives for [employer].  2 to 41-month history of decreased hearing in right ear.  Denies sinus issues such as stuffy nose drainage sneezing or runny nose.  Denies tinnitus headache or dizziness.  He was seen by an ENT doctor yesterday in [place] and prescribed a 12-day prednisone Dosepak.  He just started it today.  works Outpatient Encounter Medications as of 12/23/2021  Medication Sig  . predniSONE (STERAPRED UNI-PAK 48 TAB) 10 MG (48) TBPK tablet Take by mouth 2 (two) times daily.  . meloxicam (MOBIC) 15 MG tablet Take 1 tablet daily with food for 14 days. Then take as needed. (Patient not taking: Reported on 12/23/2021)   No facility-administered encounter medications on file as of 12/23/2021.    Past Medical History:  Diagnosis Date  . Language barrier to communication     Past Surgical History:  Procedure Laterality Date  . MIDDLE EAR SURGERY Left 05/26/2008    Family History  Problem Relation Age of Onset  . Healthy Mother   . Hypertension Father     Social History   Socioeconomic History  . Marital status: Married    Spouse name: Not on file  . Number of children: Not on file  . Years of education: Not on file  . Highest education level: Not on file  Occupational History  . Not on file  Tobacco Use  . Smoking status: Never  . Smokeless tobacco: Never  Vaping Use  . Vaping Use: Never used  Substance and Sexual  Activity  . Alcohol use: No  . Drug use: No  . Sexual activity: Yes  Other Topics Concern  . Not on file  Social History Narrative  . Not on file   Social Determinants of Health   Financial Resource Strain: Not on file  Food Insecurity: Not on file  Transportation Needs: Not on file  Physical Activity: Not on file  Stress: Not on file  Social Connections: Not on file  Intimate Partner Violence: Not on file    Review of Systems  Constitutional: Negative.   HENT:  Positive for hearing loss. Negative for congestion, ear discharge, ear pain, sinus pain, sore throat and tinnitus.   Eyes:  Negative for blurred vision, discharge and redness.  Respiratory: Negative.    Cardiovascular: Negative.   Gastrointestinal:  Negative for abdominal pain, blood in stool, constipation and melena.  Genitourinary: Negative.   Musculoskeletal: Negative.  Negative for myalgias.  Skin:  Negative for rash.  Neurological:  Negative for dizziness, tingling, loss of consciousness, weakness and headaches.  Endo/Heme/Allergies:  Negative for polydipsia.  Psychiatric/Behavioral: Negative.  Negative for depression. The patient is not nervous/anxious.         Objective    BP 126/82 (BP Location: Right Arm, Patient Position: Sitting, Cuff Size: Normal)   Pulse 77  Temp 97.8 F (36.6 C) (Temporal)   Ht 5\' 10"  (1.778 m)   Wt 191 lb (86.6 kg)   SpO2 97%   BMI 27.41 kg/m   Physical Exam Constitutional:      General: He is not in acute distress.    Appearance: Normal appearance. He is not ill-appearing, toxic-appearing or diaphoretic.  HENT:     Head: Normocephalic and atraumatic.     Right Ear: External ear normal.     Left Ear: External ear normal.     Mouth/Throat:     Mouth: Mucous membranes are moist.     Pharynx: Oropharynx is clear. No oropharyngeal exudate or posterior oropharyngeal erythema.  Eyes:     General: No scleral icterus.       Right eye: No discharge.        Left eye: No  discharge.     Extraocular Movements: Extraocular movements intact.     Conjunctiva/sclera: Conjunctivae normal.     Pupils: Pupils are equal, round, and reactive to light.  Cardiovascular:     Rate and Rhythm: Normal rate and regular rhythm.  Pulmonary:     Effort: Pulmonary effort is normal. No respiratory distress.     Breath sounds: Normal breath sounds.  Abdominal:     General: Bowel sounds are normal.     Tenderness: There is no abdominal tenderness. There is no guarding.  Genitourinary:    Comments: Patient declines genital exam.  Musculoskeletal:     Cervical back: No rigidity or tenderness.  Skin:    General: Skin is warm and dry.  Neurological:     Mental Status: He is alert and oriented to person, place, and time.  Psychiatric:        Mood and Affect: Mood normal.        Behavior: Behavior normal.    {Labs (Optional):23779}    Assessment & Plan:   Problem List Items Addressed This Visit       Nervous and Auditory   Dysfunction of right eustachian tube     Other   Healthcare maintenance - Primary   Relevant Orders   CBC   Comprehensive metabolic panel   Lipid panel   Urinalysis, Routine w reflex microscopic   Ambulatory referral to Gastroenterology    Return in about 1 year (around 12/24/2022), or if symptoms worsen or fail to improve.  Recommended regular exercise.  Walking for 30 minutes daily works well.  Recommended regular dental care.  Agrees to go for consultation for colonoscopy.  Demonstrated eustachian tube exercises.  Advised him to see ENT doctor on follow-up in 10 days as recommended.  Declines flu shot today.  Information was given on health maintenance and Arabic.  02/23/2023, MD

## 2021-12-30 DIAGNOSIS — H90A21 Sensorineural hearing loss, unilateral, right ear, with restricted hearing on the contralateral side: Secondary | ICD-10-CM | POA: Diagnosis not present

## 2021-12-30 DIAGNOSIS — H9121 Sudden idiopathic hearing loss, right ear: Secondary | ICD-10-CM | POA: Diagnosis not present

## 2022-01-03 ENCOUNTER — Other Ambulatory Visit: Payer: Self-pay | Admitting: Student

## 2022-01-03 DIAGNOSIS — H90A21 Sensorineural hearing loss, unilateral, right ear, with restricted hearing on the contralateral side: Secondary | ICD-10-CM

## 2022-01-19 DIAGNOSIS — H9120 Sudden idiopathic hearing loss, unspecified ear: Secondary | ICD-10-CM | POA: Diagnosis not present

## 2022-01-25 ENCOUNTER — Ambulatory Visit
Admission: RE | Admit: 2022-01-25 | Discharge: 2022-01-25 | Disposition: A | Discharge status: Home / Self Care | Payer: 59 | Source: Ambulatory Visit | Attending: Student | Admitting: Student

## 2022-01-25 DIAGNOSIS — J320 Chronic maxillary sinusitis: Secondary | ICD-10-CM | POA: Diagnosis not present

## 2022-01-25 DIAGNOSIS — H748X3 Other specified disorders of middle ear and mastoid, bilateral: Secondary | ICD-10-CM | POA: Diagnosis not present

## 2022-01-25 DIAGNOSIS — H90A21 Sensorineural hearing loss, unilateral, right ear, with restricted hearing on the contralateral side: Secondary | ICD-10-CM

## 2022-01-25 DIAGNOSIS — H9041 Sensorineural hearing loss, unilateral, right ear, with unrestricted hearing on the contralateral side: Secondary | ICD-10-CM | POA: Diagnosis not present

## 2022-01-25 MED ORDER — GADOBUTROL 1 MMOL/ML IV SOLN
10.0000 mL | Freq: Once | INTRAVENOUS | Status: AC | PRN
  Administered 2022-01-25: 8 mL via INTRAVENOUS

## 2022-02-08 DIAGNOSIS — H9041 Sensorineural hearing loss, unilateral, right ear, with unrestricted hearing on the contralateral side: Secondary | ICD-10-CM | POA: Diagnosis not present

## 2022-02-10 DIAGNOSIS — H9121 Sudden idiopathic hearing loss, right ear: Secondary | ICD-10-CM | POA: Diagnosis not present

## 2022-02-15 DIAGNOSIS — H9121 Sudden idiopathic hearing loss, right ear: Secondary | ICD-10-CM | POA: Diagnosis not present

## 2022-02-17 DIAGNOSIS — H9121 Sudden idiopathic hearing loss, right ear: Secondary | ICD-10-CM | POA: Diagnosis not present

## 2022-03-22 DIAGNOSIS — H9120 Sudden idiopathic hearing loss, unspecified ear: Secondary | ICD-10-CM | POA: Diagnosis not present

## 2022-11-29 DIAGNOSIS — H9041 Sensorineural hearing loss, unilateral, right ear, with unrestricted hearing on the contralateral side: Secondary | ICD-10-CM | POA: Diagnosis not present

## 2022-11-30 DIAGNOSIS — H5789 Other specified disorders of eye and adnexa: Secondary | ICD-10-CM | POA: Diagnosis not present

## 2023-01-11 DIAGNOSIS — H9042 Sensorineural hearing loss, unilateral, left ear, with unrestricted hearing on the contralateral side: Secondary | ICD-10-CM | POA: Diagnosis not present

## 2023-01-11 DIAGNOSIS — H6122 Impacted cerumen, left ear: Secondary | ICD-10-CM | POA: Diagnosis not present

## 2023-02-10 DIAGNOSIS — H9071 Mixed conductive and sensorineural hearing loss, unilateral, right ear, with unrestricted hearing on the contralateral side: Secondary | ICD-10-CM | POA: Diagnosis not present

## 2023-05-31 DIAGNOSIS — H9041 Sensorineural hearing loss, unilateral, right ear, with unrestricted hearing on the contralateral side: Secondary | ICD-10-CM | POA: Diagnosis not present

## 2023-05-31 DIAGNOSIS — H903 Sensorineural hearing loss, bilateral: Secondary | ICD-10-CM | POA: Diagnosis not present

## 2023-05-31 DIAGNOSIS — H9191 Unspecified hearing loss, right ear: Secondary | ICD-10-CM | POA: Diagnosis not present

## 2023-05-31 DIAGNOSIS — H9311 Tinnitus, right ear: Secondary | ICD-10-CM | POA: Diagnosis not present

## 2024-04-30 ENCOUNTER — Ambulatory Visit: Admitting: Family Medicine

## 2024-06-11 ENCOUNTER — Encounter: Admitting: Internal Medicine
